# Patient Record
Sex: Male | Born: 1960 | Race: Black or African American | Hispanic: No | Marital: Married | Smoking: Never smoker
Health system: Southern US, Community
[De-identification: ages and names within clinical notes are randomized; demographics above are authoritative.]

## PROBLEM LIST (undated history)

## (undated) DIAGNOSIS — I2699 Other pulmonary embolism without acute cor pulmonale: Secondary | ICD-10-CM

## (undated) DIAGNOSIS — I8390 Asymptomatic varicose veins of unspecified lower extremity: Secondary | ICD-10-CM

## (undated) DIAGNOSIS — K4090 Unilateral inguinal hernia, without obstruction or gangrene, not specified as recurrent: Secondary | ICD-10-CM

## (undated) DIAGNOSIS — M109 Gout, unspecified: Secondary | ICD-10-CM

## (undated) DIAGNOSIS — G4730 Sleep apnea, unspecified: Secondary | ICD-10-CM

## (undated) DIAGNOSIS — I878 Other specified disorders of veins: Secondary | ICD-10-CM

## (undated) DIAGNOSIS — E119 Type 2 diabetes mellitus without complications: Secondary | ICD-10-CM

## (undated) DIAGNOSIS — M1990 Unspecified osteoarthritis, unspecified site: Secondary | ICD-10-CM

## (undated) DIAGNOSIS — E785 Hyperlipidemia, unspecified: Secondary | ICD-10-CM

## (undated) DIAGNOSIS — I10 Essential (primary) hypertension: Secondary | ICD-10-CM

## (undated) DIAGNOSIS — R251 Tremor, unspecified: Secondary | ICD-10-CM

## (undated) DIAGNOSIS — I82409 Acute embolism and thrombosis of unspecified deep veins of unspecified lower extremity: Secondary | ICD-10-CM

## (undated) DIAGNOSIS — I7300 Raynaud's syndrome without gangrene: Secondary | ICD-10-CM

## (undated) HISTORY — DX: Acute embolism and thrombosis of unspecified deep veins of unspecified lower extremity: I82.409

## (undated) HISTORY — PX: INGUINAL HERNIA REPAIR: SUR1180

## (undated) HISTORY — PX: TONSILLECTOMY: SHX5217

## (undated) HISTORY — DX: Asymptomatic varicose veins of unspecified lower extremity: I83.90

## (undated) HISTORY — DX: Gout, unspecified: M10.9

## (undated) HISTORY — DX: Other specified disorders of veins: I87.8

## (undated) HISTORY — DX: Raynaud's syndrome without gangrene: I73.00

## (undated) HISTORY — DX: Type 2 diabetes mellitus without complications: E11.9

## (undated) HISTORY — PX: TESTICLE REMOVAL: SHX68

## (undated) HISTORY — DX: Essential (primary) hypertension: I10

## (undated) HISTORY — DX: Hyperlipidemia, unspecified: E78.5

---

## 1998-12-12 ENCOUNTER — Ambulatory Visit: Admission: RE | Admit: 1998-12-12 | Discharge: 1998-12-12 | Payer: Self-pay

## 1999-12-17 ENCOUNTER — Encounter (INDEPENDENT_AMBULATORY_CARE_PROVIDER_SITE_OTHER): Payer: Self-pay | Admitting: Specialist

## 1999-12-17 ENCOUNTER — Other Ambulatory Visit: Admission: RE | Admit: 1999-12-17 | Discharge: 1999-12-17 | Payer: Self-pay | Admitting: *Deleted

## 2002-11-13 ENCOUNTER — Ambulatory Visit (HOSPITAL_BASED_OUTPATIENT_CLINIC_OR_DEPARTMENT_OTHER): Admission: RE | Admit: 2002-11-13 | Discharge: 2002-11-13 | Payer: Self-pay | Admitting: Pulmonary Disease

## 2004-10-30 ENCOUNTER — Ambulatory Visit: Admission: RE | Admit: 2004-10-30 | Discharge: 2004-10-30 | Payer: Self-pay | Admitting: Family Medicine

## 2005-04-20 ENCOUNTER — Encounter (INDEPENDENT_AMBULATORY_CARE_PROVIDER_SITE_OTHER): Payer: Self-pay | Admitting: Cardiology

## 2005-04-20 ENCOUNTER — Ambulatory Visit (HOSPITAL_COMMUNITY): Admission: RE | Admit: 2005-04-20 | Discharge: 2005-04-20 | Payer: Self-pay | Admitting: Rheumatology

## 2005-07-21 ENCOUNTER — Inpatient Hospital Stay (HOSPITAL_COMMUNITY): Admission: EM | Admit: 2005-07-21 | Discharge: 2005-07-26 | Payer: Self-pay | Admitting: *Deleted

## 2005-07-21 ENCOUNTER — Ambulatory Visit: Payer: Self-pay | Admitting: Cardiology

## 2005-07-21 ENCOUNTER — Encounter: Payer: Self-pay | Admitting: Cardiology

## 2005-07-21 DIAGNOSIS — I2699 Other pulmonary embolism without acute cor pulmonale: Secondary | ICD-10-CM

## 2005-07-21 HISTORY — DX: Other pulmonary embolism without acute cor pulmonale: I26.99

## 2005-08-05 ENCOUNTER — Ambulatory Visit (HOSPITAL_COMMUNITY): Admission: RE | Admit: 2005-08-05 | Discharge: 2005-08-05 | Payer: Self-pay | Admitting: Family Medicine

## 2006-12-30 ENCOUNTER — Encounter (INDEPENDENT_AMBULATORY_CARE_PROVIDER_SITE_OTHER): Payer: Self-pay | Admitting: Family Medicine

## 2006-12-30 ENCOUNTER — Ambulatory Visit: Payer: Self-pay | Admitting: Vascular Surgery

## 2006-12-30 ENCOUNTER — Ambulatory Visit (HOSPITAL_COMMUNITY): Admission: RE | Admit: 2006-12-30 | Discharge: 2006-12-30 | Payer: Self-pay | Admitting: Family Medicine

## 2007-09-05 ENCOUNTER — Ambulatory Visit: Payer: Self-pay | Admitting: Oncology

## 2007-09-12 LAB — FACTOR 8 ASSAY: Coagulation Factor VIII: 375 % — ABNORMAL HIGH (ref 73–140)

## 2007-09-12 LAB — BETA-2 GLYCOPROTEIN ANTIBODIES: Beta-2-Glycoprotein I IgM: <4 U/mL (ref <10)

## 2007-09-12 LAB — LUPUS ANTICOAGULANT PANEL
DRVVT 1:1 Mix: 40.7 secs (ref 36.1–47.0)
DRVVT: 69.6 secs — ABNORMAL HIGH (ref 36.1–47.0)
PTTLA 4:1 Mix: 49.7 secs — ABNORMAL HIGH (ref 36.3–48.8)
PTTLA Confirmation: 2.1 secs (ref <8.0)

## 2007-09-12 LAB — FACTOR 5 LEIDEN

## 2007-09-12 LAB — CARDIOLIPIN ANTIBODIES, IGG, IGM, IGA
Anticardiolipin IgG: <7 [GPL'U] (ref <11)
Anticardiolipin IgM: <7 [MPL'U] (ref <10)

## 2007-09-12 LAB — FACTOR 12 ASSAY: Factor XII Activity: 57 % — ABNORMAL LOW (ref 58–166)

## 2007-10-02 LAB — CBC WITH DIFFERENTIAL/PLATELET
Basophils Absolute: 0 10*3/uL (ref 0.0–0.1)
EOS%: 2.1 % (ref 0.0–7.0)
Eosinophils Absolute: 0.1 10*3/uL (ref 0.0–0.5)
HCT: 42.7 % (ref 38.7–49.9)
HGB: 15 g/dL (ref 13.0–17.1)
MCH: 32.5 pg (ref 28.0–33.4)
NEUT#: 1.5 10*3/uL (ref 1.5–6.5)
NEUT%: 47.5 % (ref 40.0–75.0)
RDW: 14 % (ref 11.2–14.6)
lymph#: 1.1 10*3/uL (ref 0.9–3.3)

## 2007-10-02 LAB — COMPREHENSIVE METABOLIC PANEL
Albumin: 4.5 g/dL (ref 3.5–5.2)
BUN: 13 mg/dL (ref 6–23)
CO2: 24 mEq/L (ref 19–32)
Calcium: 9.5 mg/dL (ref 8.4–10.5)
Chloride: 101 mEq/L (ref 96–112)
Creatinine, Ser: 1.25 mg/dL (ref 0.40–1.50)
Glucose, Bld: 100 mg/dL — ABNORMAL HIGH (ref 70–99)
Potassium: 3.7 mEq/L (ref 3.5–5.3)

## 2007-10-02 LAB — LACTATE DEHYDROGENASE: LDH: 132 U/L (ref 94–250)

## 2007-10-25 ENCOUNTER — Ambulatory Visit (HOSPITAL_COMMUNITY): Admission: RE | Admit: 2007-10-25 | Discharge: 2007-10-25 | Payer: Self-pay | Admitting: Oncology

## 2007-11-28 ENCOUNTER — Ambulatory Visit: Payer: Self-pay | Admitting: Oncology

## 2008-04-25 ENCOUNTER — Ambulatory Visit: Payer: Self-pay | Admitting: Oncology

## 2008-04-29 LAB — CBC WITH DIFFERENTIAL/PLATELET
BASO%: 0.6 % (ref 0.0–2.0)
Basophils Absolute: 0 10*3/uL (ref 0.0–0.1)
EOS%: 2.1 % (ref 0.0–7.0)
HCT: 39.9 % (ref 38.7–49.9)
HGB: 13.7 g/dL (ref 13.0–17.1)
MCH: 31.8 pg (ref 28.0–33.4)
MCHC: 34.4 g/dL (ref 32.0–35.9)
MONO#: 0.3 10*3/uL (ref 0.1–0.9)
NEUT%: 51.1 % (ref 40.0–75.0)
RDW: 11.7 % (ref 11.2–14.6)
WBC: 3.6 10*3/uL — ABNORMAL LOW (ref 4.0–10.0)
lymph#: 1.3 10*3/uL (ref 0.9–3.3)

## 2008-05-03 LAB — FACTOR 8 ASSAY: Coagulation Factor VIII: 286 % — ABNORMAL HIGH (ref 73–140)

## 2008-05-03 LAB — D-DIMER, QUANTITATIVE: D-Dimer, Quant: 0.23 ug/mL-FEU (ref 0.00–0.48)

## 2008-09-25 ENCOUNTER — Ambulatory Visit: Payer: Self-pay | Admitting: Oncology

## 2008-11-09 IMAGING — CT CT ANGIO CHEST
2 of 6 series · 19 of 36 positions shown · IV contrast (APPLIED)
Comparison: 08/05/2005.

CLINICAL DATA: History of pulmonary emboli.  Recent diagnosis of
deep venous thrombosis.

CT ANGIOGRAPHY CHEST
TECHNIQUE: Multidetector CT imaging of the chest using the
standard protocol during bolus administration of intravenous
contrast. Multiplanar reconstructed images obtained and reviewed to
evaluate the vascular anatomy.
Contrast: 100 ml of Hmnipaque-D66..

[Series 7: pe 1.0 b40f thins for pacs · axial · 0.70mm/px · z∈[-287,-84]mm · 16 of 229 slices shown]
[im 13/229  lung]
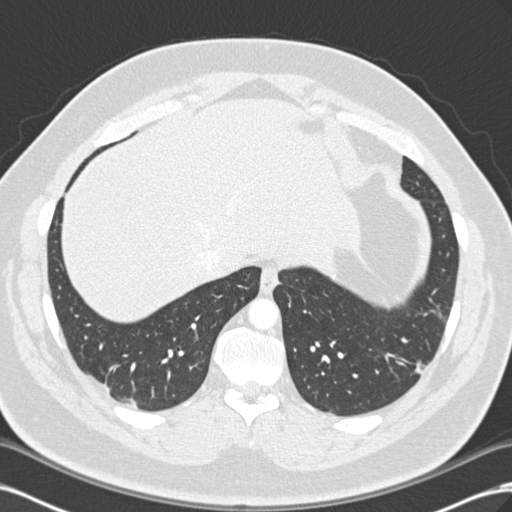
[im 26/229  mediastinal]
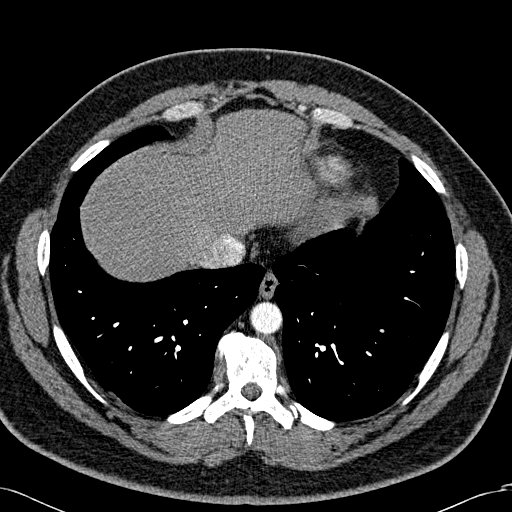
[im 39/229  lung]
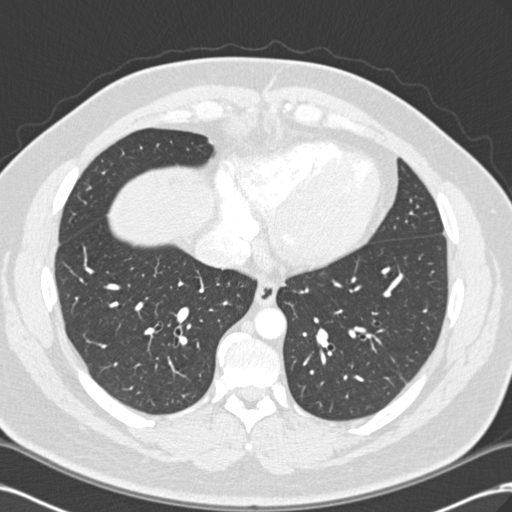
[im 51/229  mediastinal]
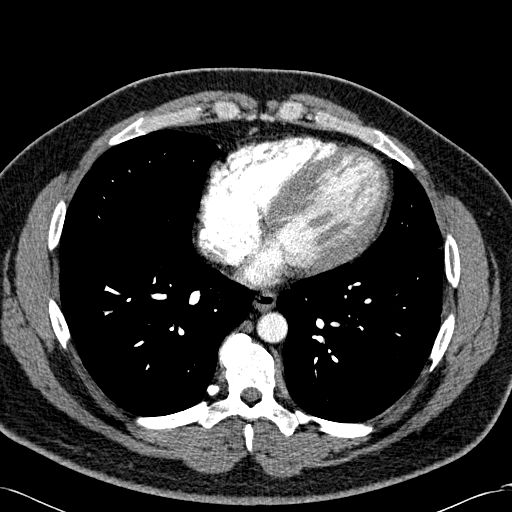
[im 64/229  lung]
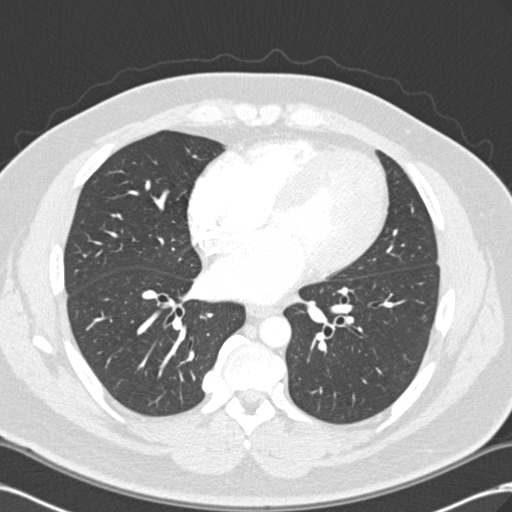
[im 77/229  mediastinal]
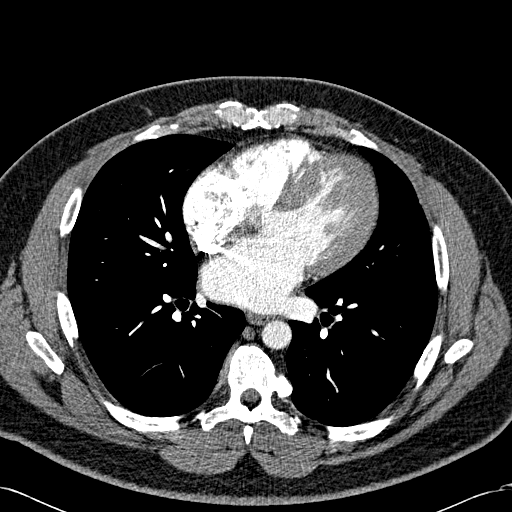
[im 89/229  lung]
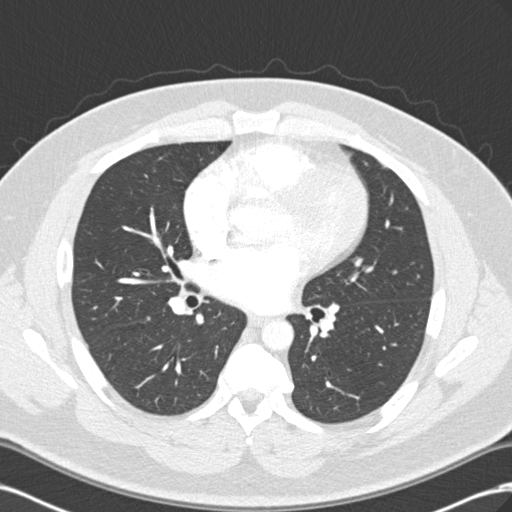
[im 102/229  mediastinal]
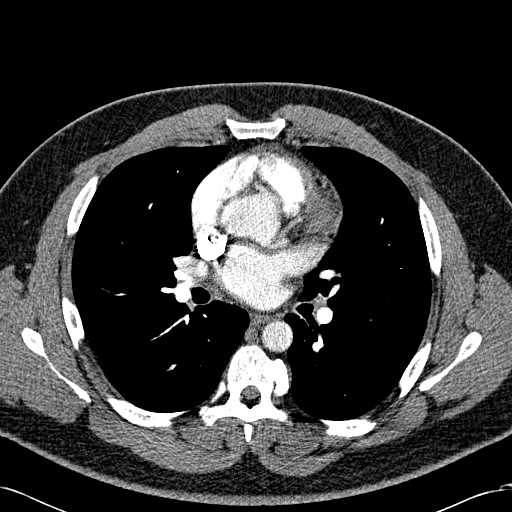
[im 127/229  lung]
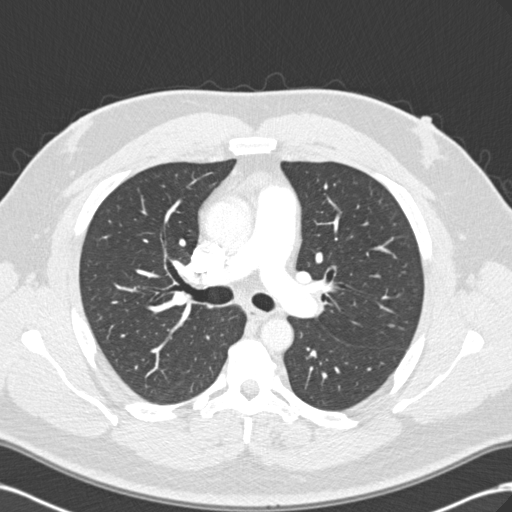
[im 140/229  mediastinal]
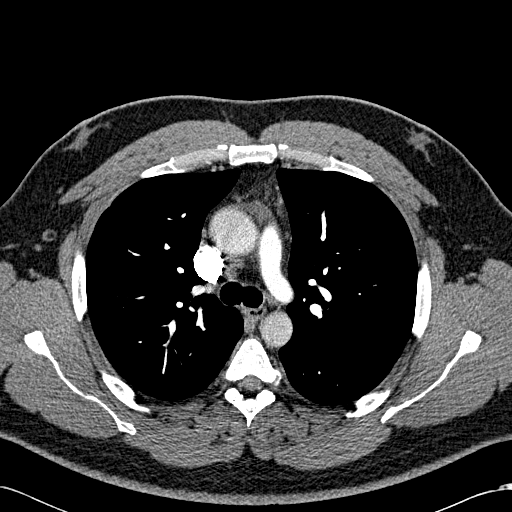
[im 153/229  lung]
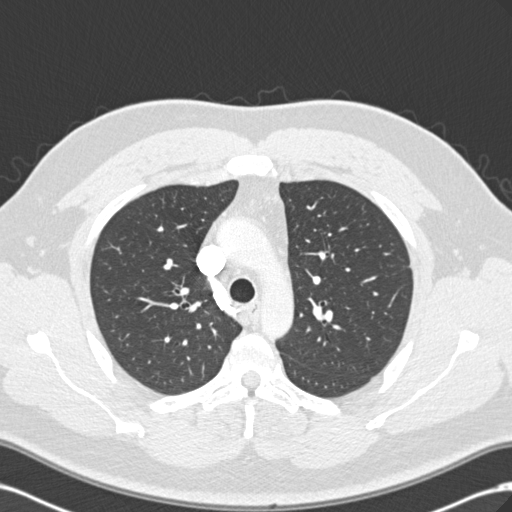
[im 165/229  mediastinal]
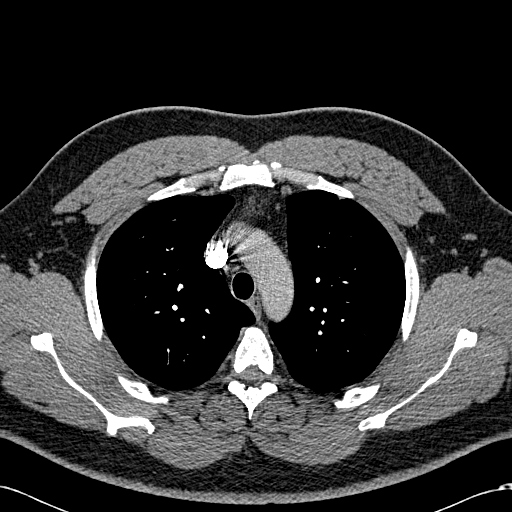
[im 178/229  lung]
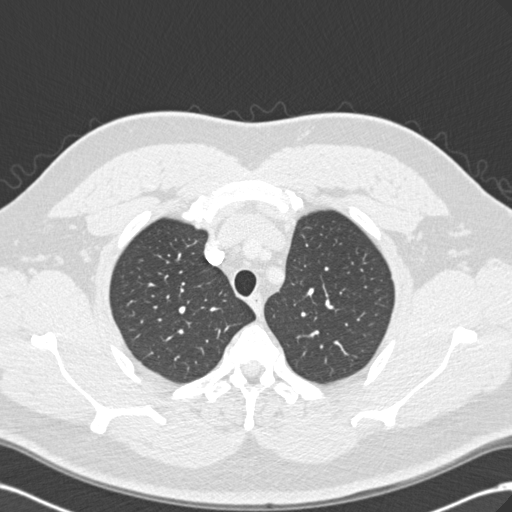
[im 191/229  mediastinal]
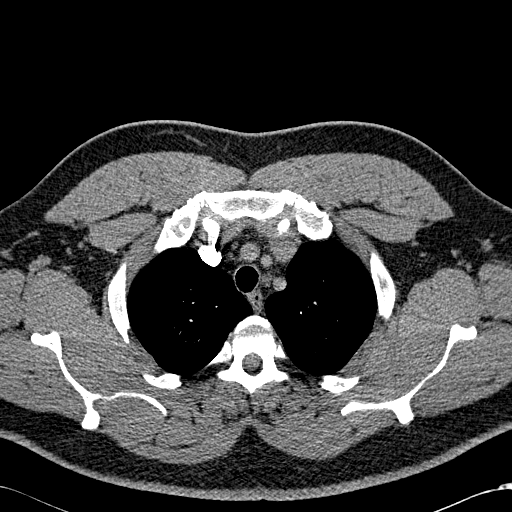
[im 203/229  lung]
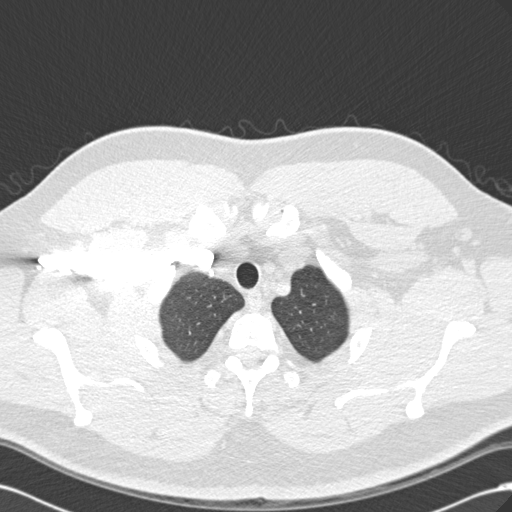
[im 216/229  mediastinal]
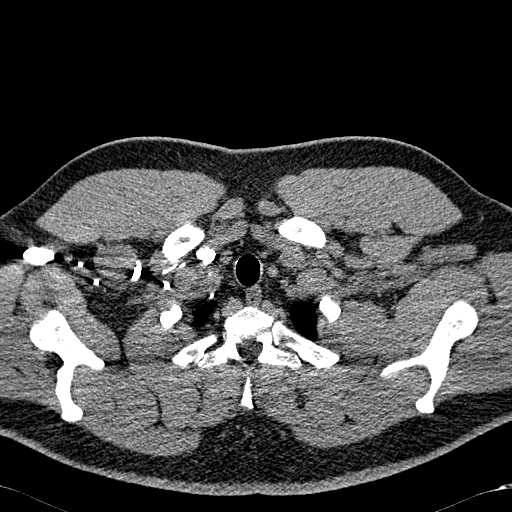

[Series 602: <mpr thick range> · coronal · 0.70mm/px · 3 of 77 slices shown]
[im 16/77  mediastinal]
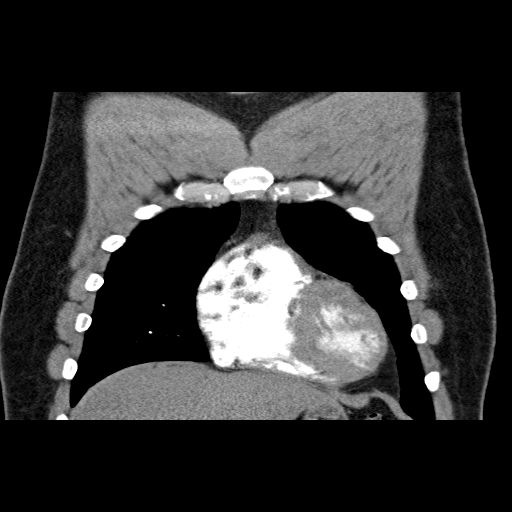
[im 31/77  mediastinal]
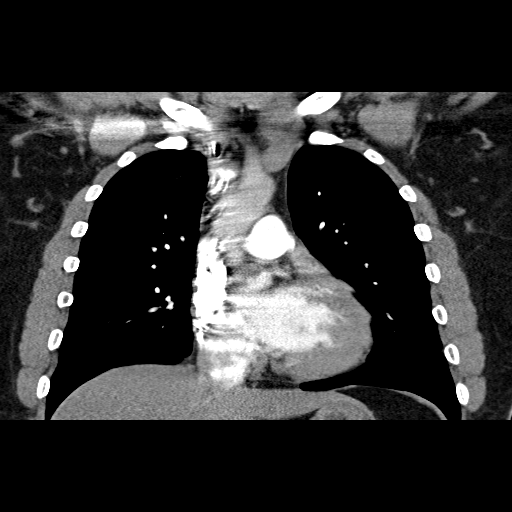
[im 46/77  mediastinal]
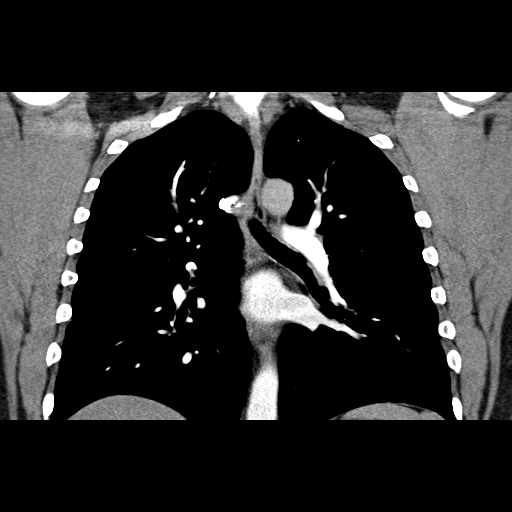

[19 of 36 positions shown; findings below may reference images not displayed]

FINDINGS: No pulmonary embolus.  No pathologically enlarged
mediastinal, hilar or axillary lymph nodes.  Incidental note is
made of bilateral gynecomastia.  Heart size within normal limits.
No pericardial effusion.

Tiny nodule right middle lobe (image 57) is nonspecific.  There is
bilateral upper and lower lobe scarring.  No pleural fluid.  Airway
unremarkable.

Incidental imaging of the upper abdomen shows no acute findings.
No worrisome lytic or sclerotic lesions.
IMPRESSION: 1.  No pulmonary embolus.

## 2010-12-11 NOTE — Discharge Summary (Signed)
Dalton Ochoa, Dalton Ochoa             ACCOUNT NO.:  [id number]   MEDICAL RECORD NO.:  [id number]          PATIENT TYPE:  INP   LOCATION:  6743                         FACILITY:  [facility]   PHYSICIAN:  Corinna L. Lendell [name], MDDATE OF BIRTH:  [birth year]   DATE OF ADMISSION:  07/20/2005  DATE OF DISCHARGE:  07/26/2005                                 DISCHARGE SUMMARY   DIAGNOSES:  1.  Acute pulmonary embolus, bilaterally.  2.  Polymyositis.  3.  Diabetes.  4.  Hypertension.  5.  Reported history of vasculitis.  6.  Poor venous axis.   DISCHARGE MEDICATIONS:  1.  Warfarin 7.5 mg p.o. nightly which may need to be changed to keep his      INR between 2 and 3.  2.  He may continue his other outpatient medications which include      prednisone 10 mg a day.  3.  Norvasc 5 mg a day.  4.  Calcium with vitamin D supplementation.  5.  Folic acid 1 mg a day.  6.  Quinapril/hydrochlorothiazide 20/25 mg a day.  7.  Methotrexate 2.5 mg 10 tablets every Thursday.  8.  Metformin 850 mg p.o. b.i.d.  9.  Keflex 500 mg p.o. t.i.d. for chronic skin condition involving the      hands.   CONDITION:  Stable.   CONSULTATIONS:  None.   PROCEDURE:  PICC line.   DIET:  Coumadin friendly diabetic.   ACTIVITY:  Ad lib.   FOLLOW UP:  Dr. Tiburcio Pea tomorrow for INR check and any adjustment in  Coumadin.   PERTINENT LABORATORY DATA:  INR at discharge is 1.9, on admission it was  normal.  Cardiac enzymes significant for troponin 0.18. Basic metabolic  panel significant for a glucose of 135, otherwise unremarkable.  CBC on  admission was unremarkable.  Blood cultures negative.  UA also is negative  for nitrites or leukocyte esterase, calcium oxalate crystals were present as  well as 30 protein and 15 ketones.   Chest x-ray showed nothing acute.  The CT scan of the chest showed massive  bilateral pulmonary emboli with a cavitary lesion at the left lung base  possibly due to septic embolus. Tiny peripheral  densities in both lungs  nonspecific, but probably inflammatory in origin.  EKG showed sinus  tachycardia, age indeterminate septal infarct, left axis deviation.   HISTORY AND HOSPITAL COURSE:  Dalton Ochoa is a 50 year old black male with a  history of some type of myositis and vasculitis for which he takes  prednisone and is followed by Dr. Kellie Simmering.  He presented with shortness of  breath which was sudden, he had no chest pain. He was tachypneic and  tachycardic.  Please see H&P for complete details.  He had good oxygen  saturation.  Upon admission, they were unable to get adequate IV access for  CT angiogram of chest to rule out PE. He was therefore admitted and a PICC  line was placed.  CAT scan was significant for a saddle embolus and other  bilateral PEs.  The patient was started on heparin and Coumadin.  He  was  placed in the step-down unit and given supplemental oxygen.  The patient had  improvement in his shortness of breath.  He was ambulating without  difficulty. His INR was quite slow to increase and at the time of discharge  is 1.9, however, it has been steadily increasing and I suspect tomorrow will  be 2.0 or greater. I have asked that the patient follow up for an INR check  tomorrow with Dr. Tiburcio Pea and I will call to leave a voice mail with him.  He  has been given a dose of Lovenox 1.5 mg/kg subcutaneously today prior to  discharge.  If his INR is 2.0 or greater, Lovenox will not be needed any  further.   The patient had his metformin held after the CT angiogram and this was  resumed.  His blood pressure and diabetes remained relatively stable.   With respect to the cavitary area on CAT scan, the patient was not coughing,  he had no chest pain and had a normal white blood cell count.  He was not  given antibiotics and at this point, clinically this is not consistent with  a lung abscess.  I would recommend, however, that he get a repeat CT of the  chest in four to six  weeks to further evaluate this area.      Corinna L. Lendell [name], MD  Electronically Signed     CLS/MEDQ  D:  07/26/2005  T:  07/26/2005  Job:  [id number]

## 2010-12-11 NOTE — H&P (Signed)
NAME:  Dalton Ochoa, NEARHOOD NO.:  [id number]   MEDICAL RECORD NO.:  [id number]          PATIENT TYPE:  INP   LOCATION:  1831                         FACILITY:  [facility]   PHYSICIAN:  Theone Stanley, MD   DATE OF BIRTH:  10-Sep-1960   DATE OF ADMISSION:  07/20/2005  DATE OF DISCHARGE:                                HISTORY & PHYSICAL   CHIEF COMPLAINT:  Shortness of breath.   HISTORY OF PRESENT ILLNESS:  Mr. Haig is a very pleasant 50 year old  African-American gentleman with a history of myositis, vascularis, diabetes  and hypertension, who had the very onset of shortness of breath at 10:30  yesterday.  The patient was walking.  He states that he was not exerting  himself, and he suddenly became short of breath.  He had no chest pain.  The  shortness of breath lasted about 15 minutes.  He had some lightheadedness  associated with it.  It only lasted about 8 minutes.  He had some  diaphoresis, but the patient did not feel he was sweating too much.  He  received help while he was in the mall, and EMS arrived.  At that time, he  states that his blood pressure was 200 over some number; 5 minutes later it  was 140/96.  He is tachypneic and apparently tachycardic.  The patient has  never experienced this before.  The only additional thing is he noted some  left thigh pain earlier in the day, which lasted about an hour, but went  away completely.  The patient is an extremely difficult person to get IV  access.  Because of this, it was difficult to obtain a CT scan.  Therefore,  he will be admitted.  A PICC line will be placed, and he will be assessed  for PE.   PAST MEDICAL HISTORY:  1.  Myositis.  2.  Vasculitis.  3.  Diabetes.  4.  Hypertension.  5.  The patient has a history of sores on his fingers; Dr. Amanda Pea is      involved in his care.   MEDICATIONS:  1.  Prednisone 10 mg one p.o. daily.  2.  Norvasc 5 mg one p.o. daily.  3.  Calcium with vitamin D.  4.   Folic acid 1 mg daily.  5.  Quinapril/hydrochlorothiazide 20/25 one p.o. daily.  6.  Methotrexate 2.5 mg 10 tablets every Thursday.  7.  Metformin 850 mg b.i.d.  8.  Keflex 500 mg t.i.d.  He has been taking this since July secondary to      his hand issues.   ALLERGIES:  None.   FAMILY HISTORY:  Diabetes and hypertension.  There are no other family  members with myositis or vasculitis.   SOCIAL HISTORY:  The patient lives in [place].  He is married and has 2  children.  No tobacco, alcohol or illicit drug use.   REVIEW OF SYSTEMS:  Please see HPI.  Otherwise, all systems were negative.   PHYSICAL EXAMINATION:  GENERAL:  Mr. Teutsch is a very pleasant 49 year old  gentleman sitting on the gurney in no  acute distress.  VITAL SIGNS:  Temperature 98.1, blood pressure 133/70, pulse of 114,  respiratory rate 22, satting 100% on 2 liters.  HEENT:  Head is atraumatic and normocephalic.  Extraocular movements intact.  Ears and nose without discharge.  Throat clear.  NECK:  Supple.  No lymphadenopathy.  HEART:  Regular rate and rhythm.  No murmurs, rubs, or gallops heard.  LUNGS:  Clear to auscultation bilaterally.  ABDOMEN:  Soft, nontender, nondistended.  EXTREMITIES:  Mild pitting edema.  NEUROLOGIC:  The patient is alert and oriented x3.  Nonfocal.  GU:  Deferred.  SKIN:  The patient had evidence of dry skin in the lower extremities.   LABORATORIES AND RADIOLOGY:  EKG did not show any acute changes.  Chest x-  ray did not show any active disease.  Sodium 137, potassium 4.0, chloride  107, BUN 12, glucose 135, hemoglobin of 14, hematocrit of 42, creatinine 1,  white count 7, hemoglobin 13, hematocrit 39, platelets 206.  INR is 1.  Troponin less than 0.05.   ASSESSMENT AND PLAN:  Mr. Kyne presented with the sudden onset of  shortness of breath.  1.  Shortness of breath.  Initial work-up including EKG, chest x-ray and      laboratories were not conclusive.  Because of his  history of vasculitis      and the sudden onset, there was concern for pulmonary embolism.      However, during his stay, the was quite difficult to obtain any type of      IV access.  The ER doctor attempted to place a central line.  This was      successful; however, it was the wrong central line.  An attempt was made      to change it over wire; however, this was not successful.  He will be      admitted at this time.  PICC line placed for assessment for PE.  In the      meantime, because the suspicion is high since there is no other evidence      of cardiac, will treat him as if he does have a pulmonary embolism, and      once imaging is obtained, it will be decided to continue or not at that      point in time.  2.  Myositis/vasculitis.  Continue on his home medications.  3.  Hypertension.  Continue on his home medications.  4.  Diabetes.  Will hold off on giving his metformin, since we anticipate      him getting a CT angiogram.  5.  Finger sores.  Continue on his prophylactic Keflex.      Theone Stanley, MD  Electronically Signed     AEJ/MEDQ  D:  07/21/2005  T:  07/21/2005  Job:  [id number]   cc:   Aundra Dubin, M.D.  [address]  [place]  [place] [address]   Juluis Mire, M.D.  Fax: [phone]   Dionne Ano. Everlene Other, M.D.  Fax: [phone]

## 2012-02-02 ENCOUNTER — Other Ambulatory Visit: Payer: Self-pay | Admitting: Sports Medicine

## 2012-02-02 DIAGNOSIS — M545 Low back pain: Secondary | ICD-10-CM

## 2012-02-04 ENCOUNTER — Other Ambulatory Visit: Payer: Self-pay

## 2012-02-05 ENCOUNTER — Ambulatory Visit
Admission: RE | Admit: 2012-02-05 | Discharge: 2012-02-05 | Disposition: A | Discharge status: Home / Self Care | Payer: BC Managed Care – PPO | Source: Ambulatory Visit | Attending: Sports Medicine | Admitting: Sports Medicine

## 2012-02-05 DIAGNOSIS — M545 Low back pain: Secondary | ICD-10-CM

## 2012-10-24 ENCOUNTER — Encounter (HOSPITAL_BASED_OUTPATIENT_CLINIC_OR_DEPARTMENT_OTHER): Payer: BC Managed Care – PPO | Attending: General Surgery

## 2012-10-24 DIAGNOSIS — I10 Essential (primary) hypertension: Secondary | ICD-10-CM | POA: Insufficient documentation

## 2012-10-24 DIAGNOSIS — Z86711 Personal history of pulmonary embolism: Secondary | ICD-10-CM | POA: Insufficient documentation

## 2012-10-24 DIAGNOSIS — Z79899 Other long term (current) drug therapy: Secondary | ICD-10-CM | POA: Insufficient documentation

## 2012-10-24 DIAGNOSIS — L97909 Non-pressure chronic ulcer of unspecified part of unspecified lower leg with unspecified severity: Secondary | ICD-10-CM | POA: Insufficient documentation

## 2012-10-24 DIAGNOSIS — E785 Hyperlipidemia, unspecified: Secondary | ICD-10-CM | POA: Insufficient documentation

## 2012-10-24 DIAGNOSIS — E119 Type 2 diabetes mellitus without complications: Secondary | ICD-10-CM | POA: Insufficient documentation

## 2012-10-24 DIAGNOSIS — Z86718 Personal history of other venous thrombosis and embolism: Secondary | ICD-10-CM | POA: Insufficient documentation

## 2012-10-25 NOTE — H&P (Signed)
NAME:  Dalton Ochoa, Dalton Ochoa NO.:  [id number]  MEDICAL RECORD NO.:  [id number]  LOCATION:  FOOT                         FACILITY:  [facility]  PHYSICIAN:  Joanne Gavel, M.D.        DATE OF BIRTH:  May 21, 1961  DATE OF ADMISSION:  10/24/2012 DATE OF DISCHARGE:                             HISTORY & PHYSICAL   CHIEF COMPLAINT:  Wound, right leg.  HISTORY OF PRESENT ILLNESS:  This 52 year old male with a history of DVT and pulmonary embolism in 2006, has had stasis changes in both legs since, developed a wound above his right ankle several weeks ago.  He has not been treating this with any medication.  PAST MEDICAL HISTORY:  Significant for hyperlipidemia, hypogonadism, diabetes type 2, dermatomyositis, hypertension, eczema, leukopenia, Raynaud phenomena, and venous stasis.  He had a pulmonary embolus in 2006.  PAST SURGICAL HISTORY:  Negative.  SOCIAL HISTORY:  Cigarettes none.  Alcohol none.  MEDICATIONS:  Warfarin, Testim, metformin, pravastatin, quinapril, tramadol, betamethasone, Cipro.  REVIEW OF SYSTEMS:  As above.  PHYSICAL EXAMINATION:  VITAL SIGNS:  Temp 98.1, pulse 85, respirations 18, blood pressure 127/82. GENERAL APPEARANCE:  Well developed, somewhat obese, in no distress. CRANIUM:  Normocephalic. CHEST:  Clear. HEART:  Regular rhythm. ABDOMEN:  Not examined. EXTREMITIES:  Examination of the right lower extremity reveals marked stasis changes.  ABI is 1.2.  There is a 1.0 x 1.2 rather superficial wound at the right medial lower extremity just above the malleolus. This is in the midst of a sizable area of stasis dermatitis.  There is no odor or discharge.  IMPRESSION:  Chronic venous hypertension with ulceration.  PLAN:  We will start with silver alginate and Unna boot.  We will get venous studies and I will see him in 7 days.     Joanne Gavel, M.D.     RA/MEDQ  D:  10/24/2012  T:  10/25/2012  Job:  [id number]

## 2012-11-01 ENCOUNTER — Other Ambulatory Visit (HOSPITAL_COMMUNITY): Payer: Self-pay | Admitting: General Surgery

## 2012-11-01 DIAGNOSIS — I739 Peripheral vascular disease, unspecified: Secondary | ICD-10-CM

## 2012-11-03 ENCOUNTER — Ambulatory Visit (HOSPITAL_COMMUNITY)
Admission: RE | Admit: 2012-11-03 | Discharge: 2012-11-03 | Disposition: A | Discharge status: Home / Self Care | Payer: BC Managed Care – PPO | Source: Ambulatory Visit | Attending: Cardiovascular Disease | Admitting: Cardiovascular Disease

## 2012-11-03 DIAGNOSIS — I739 Peripheral vascular disease, unspecified: Secondary | ICD-10-CM | POA: Insufficient documentation

## 2012-11-03 NOTE — Progress Notes (Signed)
Venous Duplex Lower Ext. Completed. Dalton Ochoa D  

## 2014-06-05 ENCOUNTER — Encounter (HOSPITAL_BASED_OUTPATIENT_CLINIC_OR_DEPARTMENT_OTHER): Payer: 59 | Attending: General Surgery

## 2014-06-05 DIAGNOSIS — L97312 Non-pressure chronic ulcer of right ankle with fat layer exposed: Secondary | ICD-10-CM | POA: Diagnosis not present

## 2014-06-05 DIAGNOSIS — I87331 Chronic venous hypertension (idiopathic) with ulcer and inflammation of right lower extremity: Secondary | ICD-10-CM | POA: Diagnosis not present

## 2014-06-06 NOTE — Progress Notes (Signed)
[facility]  NAME:  Dalton Ochoa, Dalton Ochoa NO.:  [id number]  MEDICAL RECORD NO.:  [id number]      DATE OF BIRTH:  Apr 24, 1961  PHYSICIAN:  Judene Companion, M.D.           VISIT DATE:                                  OFFICE VISIT   This is a 53 year old African American male who has a long history of venous hypertension with ulcers that have occurred bilaterally.  Today, he presents to the [facility]ound [facility] with venous ulcers on the medial side of his right ankle.  He has been treated before successfully with Unna boots and we elected to put some silver alginate over the ulcer and then treated him with an Haematologist.  This gentleman had a blood pressure of 139/74, respirations 20, pulse 78, temperature 98.  He is 5 feet and 11 inches, weighs 250 pounds.  His medications include, metformin as he is a type 2 diabetic, Xarelto as an anticoagulate, benazepril for his hypertension, allopurinol for gout.  He does not appear to have any infection and he did not need any debridement.  I think that if we keep him with compression with an Unna boot, he will heal up just like he has in the past.  He has evidence on his skin of bilateral venous stasis.  DIAGNOSES: 1. Bilateral venous stasis with ulcers, right ankle. 2. Type 2 diabetes. 3. Hypertension. 4. Gout.     Judene Companion, M.D.     PP/MEDQ  D:  06/05/2014  T:  06/06/2014  Job:  [id number]

## 2014-06-12 DIAGNOSIS — I87331 Chronic venous hypertension (idiopathic) with ulcer and inflammation of right lower extremity: Secondary | ICD-10-CM | POA: Diagnosis not present

## 2014-06-12 DIAGNOSIS — L97312 Non-pressure chronic ulcer of right ankle with fat layer exposed: Secondary | ICD-10-CM | POA: Diagnosis not present

## 2014-06-19 DIAGNOSIS — I87331 Chronic venous hypertension (idiopathic) with ulcer and inflammation of right lower extremity: Secondary | ICD-10-CM | POA: Diagnosis not present

## 2014-06-19 DIAGNOSIS — L97312 Non-pressure chronic ulcer of right ankle with fat layer exposed: Secondary | ICD-10-CM | POA: Diagnosis not present

## 2014-06-26 ENCOUNTER — Encounter (HOSPITAL_BASED_OUTPATIENT_CLINIC_OR_DEPARTMENT_OTHER): Payer: 59 | Attending: General Surgery

## 2014-06-26 DIAGNOSIS — L97919 Non-pressure chronic ulcer of unspecified part of right lower leg with unspecified severity: Secondary | ICD-10-CM | POA: Insufficient documentation

## 2014-06-26 DIAGNOSIS — I87331 Chronic venous hypertension (idiopathic) with ulcer and inflammation of right lower extremity: Secondary | ICD-10-CM | POA: Insufficient documentation

## 2014-06-26 DIAGNOSIS — L97319 Non-pressure chronic ulcer of right ankle with unspecified severity: Secondary | ICD-10-CM | POA: Insufficient documentation

## 2014-07-03 DIAGNOSIS — L97919 Non-pressure chronic ulcer of unspecified part of right lower leg with unspecified severity: Secondary | ICD-10-CM | POA: Diagnosis not present

## 2014-07-03 DIAGNOSIS — I87331 Chronic venous hypertension (idiopathic) with ulcer and inflammation of right lower extremity: Secondary | ICD-10-CM | POA: Diagnosis not present

## 2014-07-03 DIAGNOSIS — L97319 Non-pressure chronic ulcer of right ankle with unspecified severity: Secondary | ICD-10-CM | POA: Diagnosis not present

## 2014-07-09 ENCOUNTER — Other Ambulatory Visit: Payer: Self-pay | Admitting: *Deleted

## 2014-07-09 ENCOUNTER — Encounter: Payer: Self-pay | Admitting: Vascular Surgery

## 2014-07-09 DIAGNOSIS — L97319 Non-pressure chronic ulcer of right ankle with unspecified severity: Secondary | ICD-10-CM

## 2014-07-09 DIAGNOSIS — I87311 Chronic venous hypertension (idiopathic) with ulcer of right lower extremity: Secondary | ICD-10-CM

## 2014-07-09 DIAGNOSIS — L97919 Non-pressure chronic ulcer of unspecified part of right lower leg with unspecified severity: Secondary | ICD-10-CM

## 2014-07-09 DIAGNOSIS — I872 Venous insufficiency (chronic) (peripheral): Secondary | ICD-10-CM

## 2014-07-10 DIAGNOSIS — L97319 Non-pressure chronic ulcer of right ankle with unspecified severity: Secondary | ICD-10-CM | POA: Diagnosis not present

## 2014-07-10 DIAGNOSIS — L97919 Non-pressure chronic ulcer of unspecified part of right lower leg with unspecified severity: Secondary | ICD-10-CM | POA: Diagnosis not present

## 2014-07-10 DIAGNOSIS — I87331 Chronic venous hypertension (idiopathic) with ulcer and inflammation of right lower extremity: Secondary | ICD-10-CM | POA: Diagnosis not present

## 2014-07-17 ENCOUNTER — Encounter: Payer: Self-pay | Admitting: Vascular Surgery

## 2014-07-17 DIAGNOSIS — L97319 Non-pressure chronic ulcer of right ankle with unspecified severity: Secondary | ICD-10-CM | POA: Diagnosis not present

## 2014-07-17 DIAGNOSIS — I87331 Chronic venous hypertension (idiopathic) with ulcer and inflammation of right lower extremity: Secondary | ICD-10-CM | POA: Diagnosis not present

## 2014-07-17 DIAGNOSIS — L97919 Non-pressure chronic ulcer of unspecified part of right lower leg with unspecified severity: Secondary | ICD-10-CM | POA: Diagnosis not present

## 2014-07-18 ENCOUNTER — Encounter: Payer: 59 | Admitting: Vascular Surgery

## 2014-07-18 ENCOUNTER — Ambulatory Visit (HOSPITAL_COMMUNITY)
Admission: RE | Admit: 2014-07-18 | Discharge: 2014-07-18 | Disposition: A | Discharge status: Home / Self Care | Payer: 59 | Source: Ambulatory Visit | Attending: Vascular Surgery | Admitting: Vascular Surgery

## 2014-07-18 ENCOUNTER — Ambulatory Visit (INDEPENDENT_AMBULATORY_CARE_PROVIDER_SITE_OTHER): Payer: 59 | Admitting: Vascular Surgery

## 2014-07-18 ENCOUNTER — Encounter: Payer: Self-pay | Admitting: Vascular Surgery

## 2014-07-18 ENCOUNTER — Encounter (HOSPITAL_COMMUNITY): Payer: 59

## 2014-07-18 VITALS — BP 141/84 | HR 88 | Ht 71.0 in | Wt 260.1 lb

## 2014-07-18 DIAGNOSIS — I872 Venous insufficiency (chronic) (peripheral): Secondary | ICD-10-CM

## 2014-07-18 DIAGNOSIS — I83018 Varicose veins of right lower extremity with ulcer other part of lower leg: Secondary | ICD-10-CM

## 2014-07-18 DIAGNOSIS — I8311 Varicose veins of right lower extremity with inflammation: Secondary | ICD-10-CM

## 2014-07-18 DIAGNOSIS — I83015 Varicose veins of right lower extremity with ulcer other part of foot: Secondary | ICD-10-CM

## 2014-07-18 DIAGNOSIS — I83011 Varicose veins of right lower extremity with ulcer of thigh: Secondary | ICD-10-CM

## 2014-07-18 DIAGNOSIS — I83019 Varicose veins of right lower extremity with ulcer of unspecified site: Secondary | ICD-10-CM

## 2014-07-18 DIAGNOSIS — I87311 Chronic venous hypertension (idiopathic) with ulcer of right lower extremity: Secondary | ICD-10-CM | POA: Diagnosis not present

## 2014-07-18 DIAGNOSIS — I83013 Varicose veins of right lower extremity with ulcer of ankle: Secondary | ICD-10-CM

## 2014-07-18 DIAGNOSIS — L97319 Non-pressure chronic ulcer of right ankle with unspecified severity: Secondary | ICD-10-CM | POA: Insufficient documentation

## 2014-07-18 DIAGNOSIS — I83014 Varicose veins of right lower extremity with ulcer of heel and midfoot: Secondary | ICD-10-CM

## 2014-07-18 DIAGNOSIS — I8312 Varicose veins of left lower extremity with inflammation: Secondary | ICD-10-CM | POA: Diagnosis not present

## 2014-07-18 DIAGNOSIS — I83012 Varicose veins of right lower extremity with ulcer of calf: Secondary | ICD-10-CM

## 2014-07-18 DIAGNOSIS — L97919 Non-pressure chronic ulcer of unspecified part of right lower leg with unspecified severity: Principal | ICD-10-CM

## 2014-07-18 NOTE — Progress Notes (Signed)
Patient name: Dalton Ochoa MRN: [id number] DOB: Oct 23, 1960 Sex: male   Referred by: Kenton Kingfisher  Reason for referral:  Chief Complaint  Patient presents with  . New Evaluation    venous stasis L LE w/ ulcer of R ankle    HISTORY OF PRESENT ILLNESS: Patient is today for evaluation of severe bilateral venous stasis ulceration and venous hypertension. He has a history of prior pulmonary embolus around 2006.  He had been on Coumadin for many years and recently was changed to Xarelto. He has had venous ulcers on his right ankle on several different occasions does not have any history of venous ulcer on the left. He does have marked chronic changes of venous hypertension with hemosiderin and the skin changes bilaterally. Has had no further episodes of DVT. He does report pain associated with prolonged standing he does have marked swelling. He has been in compression garments for many years. Fortunately was able to heal up his most recent venous ulcer with care at the wound center.  Past Medical History  Diagnosis Date  . Venous stasis   . Diabetes mellitus without complication   . Hypertension   . Gout   . DVT (deep venous thrombosis)     History reviewed. No pertinent past surgical history.  History   Social History  . Marital Status: Married    Spouse Name: N/A    Number of Children: N/A  . Years of Education: N/A   Occupational History  . Not on file.   Social History Main Topics  . Smoking status: Never Smoker   . Smokeless tobacco: Not on file  . Alcohol Use: 0.0 oz/week    0 Not specified per week     Comment: occ  . Drug Use: No  . Sexual Activity: Not on file   Other Topics Concern  . Not on file   Social History Narrative    Family History  Problem Relation Age of Onset  . Deep vein thrombosis Mother   . Hypertension Mother   . Diabetes Father     Allergies as of 07/18/2014  . (No Known Allergies)    Current Outpatient Prescriptions on File  Prior to Visit  Medication Sig Dispense Refill  . ALLOPURINOL PO Take 300 mg by mouth daily.     [name] BENAZEPRIL HCL PO Take 20 mg by mouth daily.     [name] METFORMIN HCL PO Take 500 mg by mouth 2 (two) times daily.     . Rivaroxaban (XARELTO PO) Take 20 mg by mouth daily.      No current facility-administered medications on file prior to visit.     REVIEW OF SYSTEMS:  Positives indicated with an "X"  CARDIOVASCULAR:  [ ]  chest pain   [ ]  chest pressure   [ ]  palpitations   [ ]  orthopnea   [ ]  dyspnea on exertion   [ ]  claudication   [ ]  rest pain   [ x] DVT   [ ]  phlebitis PULMONARY:   [ ]  productive cough   [ ]  asthma   [ ]  wheezing NEUROLOGIC:   [ ]  weakness  [ ]  paresthesias  [ ]  aphasia  [ ]  amaurosis  [ ]  dizziness HEMATOLOGIC:   [ ]  bleeding problems   [ ]  clotting disorders MUSCULOSKELETAL:  [ ]  joint pain   [ ]  joint swelling GASTROINTESTINAL: [ ]   blood in stool  [ ]   hematemesis GENITOURINARY:  [ ]   dysuria  [ ]   hematuria PSYCHIATRIC:  [ ]  history of major depression INTEGUMENTARY:  [ ]  rashes  [ ]  ulcers CONSTITUTIONAL:  [ ]  fever   [ ]  chills  PHYSICAL EXAMINATION:  General: The patient is a well-nourished male, in no acute distress. Vital signs are BP 141/84 mmHg  Pulse 88  Ht 5\' 11"  (1.803 m)  Wt 260 lb 1.6 oz (117.981 kg)  BMI 36.29 kg/m2  SpO2 100% Pulmonary: There is a good air exchange   Musculoskeletal: There are no major deformities.  There is no significant extremity pain. Neurologic: No focal weakness or paresthesias are detected, Skin: Brawny edema with superficial hemosiderin deposit a bilaterally from his midcalf down to his ankle. Marked swelling bilaterally Psychiatric: The patient has normal affect.  Pulse status: 2+ dorsalis pedis pulses bilaterally  [facility] Vascular Lab Studies:  Ordered and Independently Reviewed bilateral superficial and some deep venous reflux. On the right leg he does have marked enlargement throughout his great saphenous vein  from the saphenofemoral junction down to his calf. Does have reflux in his common superficial femoral and popliteal vein. 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 On the left leg he has had deep venous reflux and does not have any significant deep venous reflux in his great saphenous vein. Does have reflux in his small saphenous vein with diameter of 4.7 2.8 cm.  Impression and Plan:  Severe bilateral venous stasis disease and venous hypertension. I explained the critical importance of continued compression which he is doing. Suspect that he would be an excellent candidate for ablation of his right great saphenous and potentially left small saphenous. I did image his right small saphenous vein and this does show reflux but is not markedly enlarged so would not recommend this at this time. We will see him again in February to determine has conservative treatment. We'll make a decision at that time for potential ablation of his right great saphenous vein. Would discontinue his Xarelto around the time of the procedure    Daphnie Venturini [facility] Office: [phone]

## 2014-08-07 ENCOUNTER — Encounter (HOSPITAL_COMMUNITY): Payer: 59

## 2014-08-07 ENCOUNTER — Encounter: Payer: 59 | Admitting: Vascular Surgery

## 2014-09-06 ENCOUNTER — Encounter: Payer: Self-pay | Admitting: Vascular Surgery

## 2014-09-10 ENCOUNTER — Encounter: Payer: Self-pay | Admitting: Vascular Surgery

## 2014-09-10 ENCOUNTER — Ambulatory Visit (INDEPENDENT_AMBULATORY_CARE_PROVIDER_SITE_OTHER): Payer: 59 | Admitting: Vascular Surgery

## 2014-09-10 VITALS — BP 150/87 | HR 82 | Ht 71.0 in | Wt 269.6 lb

## 2014-09-10 DIAGNOSIS — I83015 Varicose veins of right lower extremity with ulcer other part of foot: Secondary | ICD-10-CM

## 2014-09-10 DIAGNOSIS — I83018 Varicose veins of right lower extremity with ulcer other part of lower leg: Secondary | ICD-10-CM

## 2014-09-10 DIAGNOSIS — I83012 Varicose veins of right lower extremity with ulcer of calf: Secondary | ICD-10-CM

## 2014-09-10 DIAGNOSIS — I83011 Varicose veins of right lower extremity with ulcer of thigh: Secondary | ICD-10-CM

## 2014-09-10 DIAGNOSIS — I83019 Varicose veins of right lower extremity with ulcer of unspecified site: Secondary | ICD-10-CM

## 2014-09-10 DIAGNOSIS — I83014 Varicose veins of right lower extremity with ulcer of heel and midfoot: Secondary | ICD-10-CM

## 2014-09-10 DIAGNOSIS — I83013 Varicose veins of right lower extremity with ulcer of ankle: Secondary | ICD-10-CM

## 2014-09-10 DIAGNOSIS — L97919 Non-pressure chronic ulcer of unspecified part of right lower leg with unspecified severity: Principal | ICD-10-CM

## 2014-09-10 NOTE — Progress Notes (Signed)
Problems with Activities of Daily Living Secondary to Leg Pain  1. Dalton Ochoa works 12 hour shifts standing for prolonged periods.. That is very difficult for him due to leg pain.    2. Dalton Ochoa states that he has great difficulty when driving car on long trips due to leg pain.    3. Dalton Ochoa, Dalton Ochoa states he has difficulty sleeping due to leg pain.   Failure of  Conservative Therapy:  1. Worn 20-30 mm Hg thigh high compression hose >3 months with no relief of symptoms.  2. Frequently elevates legs-no relief of symptoms  3. Taken Ibuprofen 600 Mg TID with no relief of symptoms.   Here today for follow-up of his severe bilateral venous hypertension. He has been extremely compliant with her graduated compression garments. Fortunately has had no recurrent venous ulceration.   On physical exam: Palpable dorsalis pedis pulses bilaterally Marked changes of venous hypertension with telangiectasia circumferentially over his distal calves extending onto his feet bilaterally. Moderate swelling.   I reviewed his venous duplex from several months ago with the patient. Also reimage his veins with SonoSite. He has markedly enlarged great saphenous vein on the right and small saphenous vein on the left.  I have recommended staged bilateral treatment. I would recommend great saphenous vein ablation on the right. He did have reflux in his right small right small saphenous vein by his former duplex but on imaging this the vein is relatively small and I would recommend observation only regarding his right small saphenous vein. I would repeat recommend ablation of his left small saphenous vein due to severe reflux and marked dilatation. I did explain that this is an outpatient procedure under local anesthesia lasting approximately 1 hour. I recommend that we would stop his Xaralto 02 days prior to the procedure and resuming today following. Did explain the very slight risk of DVT with the procedure. He wishes  to proceed as soon as possible.

## 2014-09-17 ENCOUNTER — Other Ambulatory Visit: Payer: Self-pay | Admitting: *Deleted

## 2014-09-17 DIAGNOSIS — I83013 Varicose veins of right lower extremity with ulcer of ankle: Secondary | ICD-10-CM

## 2014-09-17 DIAGNOSIS — L97319 Non-pressure chronic ulcer of right ankle with unspecified severity: Principal | ICD-10-CM

## 2014-10-09 ENCOUNTER — Encounter: Payer: Self-pay | Admitting: Vascular Surgery

## 2014-10-10 ENCOUNTER — Ambulatory Visit (INDEPENDENT_AMBULATORY_CARE_PROVIDER_SITE_OTHER): Payer: 59 | Admitting: Vascular Surgery

## 2014-10-10 ENCOUNTER — Encounter: Payer: Self-pay | Admitting: Vascular Surgery

## 2014-10-10 VITALS — BP 130/81 | HR 80 | Resp 18 | Ht 70.0 in | Wt 266.0 lb

## 2014-10-10 DIAGNOSIS — I83011 Varicose veins of right lower extremity with ulcer of thigh: Secondary | ICD-10-CM

## 2014-10-10 DIAGNOSIS — I83014 Varicose veins of right lower extremity with ulcer of heel and midfoot: Secondary | ICD-10-CM

## 2014-10-10 DIAGNOSIS — L97909 Non-pressure chronic ulcer of unspecified part of unspecified lower leg with unspecified severity: Secondary | ICD-10-CM

## 2014-10-10 DIAGNOSIS — I83013 Varicose veins of right lower extremity with ulcer of ankle: Secondary | ICD-10-CM

## 2014-10-10 DIAGNOSIS — L97919 Non-pressure chronic ulcer of unspecified part of right lower leg with unspecified severity: Principal | ICD-10-CM

## 2014-10-10 DIAGNOSIS — I83012 Varicose veins of right lower extremity with ulcer of calf: Secondary | ICD-10-CM

## 2014-10-10 DIAGNOSIS — I83018 Varicose veins of right lower extremity with ulcer other part of lower leg: Secondary | ICD-10-CM

## 2014-10-10 DIAGNOSIS — I83019 Varicose veins of right lower extremity with ulcer of unspecified site: Secondary | ICD-10-CM

## 2014-10-10 DIAGNOSIS — I83015 Varicose veins of right lower extremity with ulcer other part of foot: Secondary | ICD-10-CM

## 2014-10-10 DIAGNOSIS — I83009 Varicose veins of unspecified lower extremity with ulcer of unspecified site: Secondary | ICD-10-CM | POA: Insufficient documentation

## 2014-10-10 HISTORY — PX: ENDOVENOUS ABLATION SAPHENOUS VEIN W/ LASER: SUR449

## 2014-10-10 NOTE — Progress Notes (Signed)
     Laser Ablation Procedure    Date: 10/10/2014   MALVIN MORRISH DOB:1960/11/16  Consent signed: Yes    Surgeon:  Dr. Sherren Mocha Early  Procedure: Laser Ablation: right Greater Saphenous Vein  BP 130/81 mmHg  Pulse 80  Resp 18  Ht 5\' 10"  (1.778 m)  Wt 266 lb (120.657 kg)  BMI 38.17 kg/m2  Tumescent Anesthesia: 375 cc 0.9% NaCl with 50 cc Lidocaine HCL with 1% Epi and 15 cc 8.4% NaHCO3  Local Anesthesia: 4 cc Lidocaine HCL and NaHCO3 (ratio 2:1)  15 watts continuous mode        Total energy: 2474 Joules   Total time: 2: 43      Patient tolerated procedure well    Description of Procedure:  After marking the course of the secondary varicosities, the patient was placed on the operating table in the supine position, and the right leg was prepped and draped in sterile fashion.   Local anesthetic was administered and under ultrasound guidance the saphenous vein was accessed with a micro needle and guide wire; then the mirco puncture sheath was placed.  A guide wire was inserted saphenofemoral junction , followed by a 5 french sheath.  The position of the sheath and then the laser fiber below the junction was confirmed using the ultrasound.  Tumescent anesthesia was administered along the course of the saphenous vein using ultrasound guidance. The patient was placed in Trendelenburg position and protective laser glasses were placed on patient and staff, and the laser was fired at 15 watts continuous mode advancing 1-79mm/second for a total of 2474 joules.      Steri strips were applied to the stab wounds and ABD pads and thigh high compression stockings were applied.  Ace wrap bandages were applied over the phlebectomy sites and at the top of the saphenofemoral junction. Blood loss was less than 15 cc.  The patient ambulated out of the operating room having tolerated the procedure well.  Patient had the successful ablation from proximal calf to saphenofemoral junction. There was some  web like stenosis from old thrombus in the mid thigh and therefore had 2 separate laser fibers of on the proximal portion and distal portion of his great saphenous vein.

## 2014-10-15 ENCOUNTER — Telehealth: Payer: Self-pay | Admitting: *Deleted

## 2014-10-15 NOTE — Telephone Encounter (Signed)
    10/15/2014  Time: 9:06 AM   Patient Name: Dalton Ochoa  Patient of: T.F. Early  Procedure:Laser Ablation right greater saphenous vein 10-10-2014  Reached patient at home and checked  His status  Yes    Comments/Actions Taken Mr. [name] states no problems with right leg pain or swelling.   Reviewed post procedural instructions with Mr. [name] and reminded him of post LA duplex and VV FU with Dr. Donnetta Hutching on 10-17-2014.       @SIGNATURE @

## 2014-10-16 ENCOUNTER — Encounter: Payer: Self-pay | Admitting: Vascular Surgery

## 2014-10-17 ENCOUNTER — Encounter: Payer: Self-pay | Admitting: Vascular Surgery

## 2014-10-17 ENCOUNTER — Ambulatory Visit (INDEPENDENT_AMBULATORY_CARE_PROVIDER_SITE_OTHER): Payer: 59 | Admitting: Vascular Surgery

## 2014-10-17 ENCOUNTER — Ambulatory Visit (HOSPITAL_COMMUNITY)
Admission: RE | Admit: 2014-10-17 | Discharge: 2014-10-17 | Disposition: A | Discharge status: Home / Self Care | Payer: 59 | Source: Ambulatory Visit | Attending: Vascular Surgery | Admitting: Vascular Surgery

## 2014-10-17 VITALS — BP 122/75 | HR 76 | Resp 18 | Ht 70.0 in | Wt 266.0 lb

## 2014-10-17 DIAGNOSIS — I83013 Varicose veins of right lower extremity with ulcer of ankle: Secondary | ICD-10-CM

## 2014-10-17 DIAGNOSIS — I83018 Varicose veins of right lower extremity with ulcer other part of lower leg: Secondary | ICD-10-CM

## 2014-10-17 DIAGNOSIS — I83015 Varicose veins of right lower extremity with ulcer other part of foot: Secondary | ICD-10-CM

## 2014-10-17 DIAGNOSIS — I83011 Varicose veins of right lower extremity with ulcer of thigh: Secondary | ICD-10-CM | POA: Diagnosis not present

## 2014-10-17 DIAGNOSIS — Z48812 Encounter for surgical aftercare following surgery on the circulatory system: Secondary | ICD-10-CM | POA: Diagnosis not present

## 2014-10-17 DIAGNOSIS — I83014 Varicose veins of right lower extremity with ulcer of heel and midfoot: Secondary | ICD-10-CM | POA: Diagnosis not present

## 2014-10-17 DIAGNOSIS — I83012 Varicose veins of right lower extremity with ulcer of calf: Secondary | ICD-10-CM

## 2014-10-17 DIAGNOSIS — L97919 Non-pressure chronic ulcer of unspecified part of right lower leg with unspecified severity: Principal | ICD-10-CM

## 2014-10-17 DIAGNOSIS — L97319 Non-pressure chronic ulcer of right ankle with unspecified severity: Secondary | ICD-10-CM

## 2014-10-17 DIAGNOSIS — I83019 Varicose veins of right lower extremity with ulcer of unspecified site: Secondary | ICD-10-CM

## 2014-10-17 NOTE — Progress Notes (Signed)
Patient reports today for follow-up of his laser ablation of right great saphenous vein one week ago. He reports no bruising and mild discomfort associated with this. He has been compliant with his graduated compression garments.  On physical exam his insertion sites are healing without any evidence of skin irritation. Has mild erythema over the medial aspect of his thigh.  Venous duplex today shows closure of his great saphenous vein from the distal insertion with no evidence of DVT  Impression and plan successful ablation of right great saphenous vein for severe venous reflux. Is scheduled for left small saphenous vein ablation at his convenience. We will schedule this with him and he understands that similar procedure in the prone versus supine position.

## 2014-10-23 ENCOUNTER — Other Ambulatory Visit: Payer: Self-pay | Admitting: *Deleted

## 2014-10-23 DIAGNOSIS — I83892 Varicose veins of left lower extremities with other complications: Secondary | ICD-10-CM

## 2014-12-04 ENCOUNTER — Encounter: Payer: Self-pay | Admitting: Vascular Surgery

## 2014-12-05 ENCOUNTER — Ambulatory Visit (INDEPENDENT_AMBULATORY_CARE_PROVIDER_SITE_OTHER): Payer: 59 | Admitting: Vascular Surgery

## 2014-12-05 ENCOUNTER — Encounter: Payer: Self-pay | Admitting: Vascular Surgery

## 2014-12-05 VITALS — BP 130/76 | HR 72 | Resp 18 | Ht 70.0 in | Wt 266.0 lb

## 2014-12-05 DIAGNOSIS — L97919 Non-pressure chronic ulcer of unspecified part of right lower leg with unspecified severity: Principal | ICD-10-CM

## 2014-12-05 DIAGNOSIS — I83012 Varicose veins of right lower extremity with ulcer of calf: Secondary | ICD-10-CM | POA: Diagnosis not present

## 2014-12-05 DIAGNOSIS — I83015 Varicose veins of right lower extremity with ulcer other part of foot: Secondary | ICD-10-CM

## 2014-12-05 DIAGNOSIS — I83011 Varicose veins of right lower extremity with ulcer of thigh: Secondary | ICD-10-CM | POA: Diagnosis not present

## 2014-12-05 DIAGNOSIS — I83014 Varicose veins of right lower extremity with ulcer of heel and midfoot: Secondary | ICD-10-CM | POA: Diagnosis not present

## 2014-12-05 DIAGNOSIS — I83019 Varicose veins of right lower extremity with ulcer of unspecified site: Secondary | ICD-10-CM

## 2014-12-05 DIAGNOSIS — I83018 Varicose veins of right lower extremity with ulcer other part of lower leg: Secondary | ICD-10-CM

## 2014-12-05 DIAGNOSIS — I83013 Varicose veins of right lower extremity with ulcer of ankle: Secondary | ICD-10-CM | POA: Diagnosis not present

## 2014-12-05 HISTORY — PX: ENDOVENOUS ABLATION SAPHENOUS VEIN W/ LASER: SUR449

## 2014-12-05 NOTE — Progress Notes (Signed)
     Laser Ablation Procedure    Date: 12/05/2014   Dalton Ochoa DOB:[birth year]  Consent signed: Yes    Surgeon:  Dr. Sherren Mocha Early  Procedure: Laser Ablation: left Small Saphenous Vein  BP 130/76 mmHg  Pulse 72  Resp 18  Ht 5\' 10"  (1.778 m)  Wt 266 lb (120.657 kg)  BMI 38.17 kg/m2  Tumescent Anesthesia: 425 cc 0.9% NaCl with 50 cc Lidocaine HCL with 1% Epi and 15 cc 8.4% NaHCO3  Local Anesthesia: 2 cc Lidocaine HCL and NaHCO3 (ratio 2:1)  15 watts continuous mode        Total energy: 1890 JOULES   Total time: 2:06      Patient tolerated procedure well    Description of Procedure:  After marking the course of the secondary varicosities, the patient was placed on the operating table in the prone position, and the left leg was prepped and draped in sterile fashion.   Local anesthetic was administered and under ultrasound guidance the saphenous vein was accessed with a micro needle and guide wire; then the mirco puncture sheath was placed.  A guide wire was inserted saphenopopliteal junction , followed by a 5 french sheath.  The position of the sheath and then the laser fiber below the junction was confirmed using the ultrasound.  Tumescent anesthesia was administered along the course of the saphenous vein using ultrasound guidance. The patient was placed in Trendelenburg position and protective laser glasses were placed on patient and staff, and the laser was fired at 15 watts continuous mode advancing 1-93mm/second for a total of 1890 joules.    Steri strips were applied and ABD pads and thigh high compression stockings were applied.  Ace wrap bandages were applied at the top of the saphenopopliteal junction. Blood loss was less than 15 cc.  The patient ambulated out of the operating room having tolerated the procedure well.  Uneventful ablation of left small saphenous vein. Has marked changes of venous hypertension. Ablation from the area at this junction the distal middle  third of his calf up to just below the saphenofemoral popliteal junction. Will be seen again in one week

## 2014-12-06 ENCOUNTER — Telehealth: Payer: Self-pay | Admitting: *Deleted

## 2014-12-06 ENCOUNTER — Encounter: Payer: Self-pay | Admitting: Vascular Surgery

## 2014-12-06 NOTE — Telephone Encounter (Signed)
    12/06/2014  Time: 9:45 AM   Patient Name: Dalton Ochoa  Patient of: T.F. Early  Procedure:Laser Ablation left small saphenous vein 12-05-2014  Reached patient at home and checked  His status  Yes    Comments/Actions Taken: Mr. Grillo states no left leg swelling and mild discomfort posterior left calf (area that was treated ).  Reviewed post procedural instructions with him and reminded him of post laser ablation duplex and VV FU with Dr. Donnetta Hutching on 12-12-2014.       @SIGNATURE @

## 2014-12-10 ENCOUNTER — Encounter: Payer: Self-pay | Admitting: Vascular Surgery

## 2014-12-12 ENCOUNTER — Ambulatory Visit (INDEPENDENT_AMBULATORY_CARE_PROVIDER_SITE_OTHER): Payer: 59 | Admitting: Vascular Surgery

## 2014-12-12 ENCOUNTER — Ambulatory Visit (HOSPITAL_COMMUNITY)
Admission: RE | Admit: 2014-12-12 | Discharge: 2014-12-12 | Disposition: A | Discharge status: Home / Self Care | Payer: 59 | Source: Ambulatory Visit | Attending: Vascular Surgery | Admitting: Vascular Surgery

## 2014-12-12 ENCOUNTER — Encounter: Payer: Self-pay | Admitting: Vascular Surgery

## 2014-12-12 VITALS — BP 139/78 | HR 88 | Resp 16 | Ht 71.0 in | Wt 260.0 lb

## 2014-12-12 DIAGNOSIS — I83892 Varicose veins of left lower extremities with other complications: Secondary | ICD-10-CM | POA: Insufficient documentation

## 2014-12-12 DIAGNOSIS — I83018 Varicose veins of right lower extremity with ulcer other part of lower leg: Secondary | ICD-10-CM

## 2014-12-12 DIAGNOSIS — I83013 Varicose veins of right lower extremity with ulcer of ankle: Secondary | ICD-10-CM

## 2014-12-12 DIAGNOSIS — Z48812 Encounter for surgical aftercare following surgery on the circulatory system: Secondary | ICD-10-CM | POA: Diagnosis present

## 2014-12-12 DIAGNOSIS — L97919 Non-pressure chronic ulcer of unspecified part of right lower leg with unspecified severity: Principal | ICD-10-CM

## 2014-12-12 DIAGNOSIS — I83015 Varicose veins of right lower extremity with ulcer other part of foot: Secondary | ICD-10-CM

## 2014-12-12 DIAGNOSIS — I83011 Varicose veins of right lower extremity with ulcer of thigh: Secondary | ICD-10-CM | POA: Diagnosis not present

## 2014-12-12 DIAGNOSIS — I83014 Varicose veins of right lower extremity with ulcer of heel and midfoot: Secondary | ICD-10-CM

## 2014-12-12 DIAGNOSIS — I83012 Varicose veins of right lower extremity with ulcer of calf: Secondary | ICD-10-CM | POA: Diagnosis not present

## 2014-12-12 DIAGNOSIS — I83019 Varicose veins of right lower extremity with ulcer of unspecified site: Secondary | ICD-10-CM

## 2014-12-12 NOTE — Progress Notes (Signed)
Patient presents today for follow-up of his laser ablation of small saphenous vein on his left leg 1 week ago. Underwent similar treatment of right great saphenous vein in March. He reports typical amount of induration and discomfort over the ablation site. Is been compliant with his compression garment.  Past Medical History  Diagnosis Date  . Venous stasis   . Diabetes mellitus without complication   . Hypertension   . Gout   . DVT (deep venous thrombosis)   . Varicose veins     History  Substance Use Topics  . Smoking status: Never Smoker   . Smokeless tobacco: Never Used  . Alcohol Use: 0.0 oz/week    0 Standard drinks or equivalent per week     Comment: occ    Family History  Problem Relation Age of Onset  . Deep vein thrombosis Mother   . Hypertension Mother   . Diabetes Father     No Known Allergies   Current outpatient prescriptions:  .  ALLOPURINOL PO, Take 300 mg by mouth daily. , Disp: , Rfl:  .  BENAZEPRIL HCL PO, Take 20 mg by mouth daily. , Disp: , Rfl:  .  colchicine 0.6 MG tablet, Take 0.6 mg by mouth 2 (two) times daily., Disp: , Rfl:  .  furosemide (LASIX) 20 MG tablet, Take 20 mg by mouth daily., Disp: , Rfl:  .  METFORMIN HCL PO, Take 500 mg by mouth 2 (two) times daily. , Disp: , Rfl:  .  pravastatin (PRAVACHOL) 20 MG tablet, Take 20 mg by mouth daily., Disp: , Rfl:  .  Rivaroxaban (XARELTO PO), Take 20 mg by mouth daily. , Disp: , Rfl:   Filed Vitals:   12/12/14 0943  BP: 139/78  Pulse: 88  Resp: 16  Height: 5\' 11"  (1.803 m)  Weight: 260 lb (117.935 kg)    Body mass index is 36.28 kg/(m^2).       Physical exam he does have some induration and thickness. There is no skin breakdown. He has resolved the discomfort over his right medial thigh from his great saphenous vein ablation 6 weeks ago.  I did review his duplex today with the patient. This shows successful closure of his small saphenous vein on the left from the distal calf to the  junction with the popliteal vein. There is no evidence of DVT.  I also reviewed his initial duplex with the patient. This did not show any significant reflux in the left great saphenous vein. There was a slight amount of reflux at the common femoral vein and saphenofemoral junction but not throughout the saphenous vein itself. He does have reflux throughout the common femoral vein, femoral vein, popliteal vein. On the right leg he did have some reflux with moderate dilatation and his small saphenous vein. Diameter was not extensive and therefore I did not recommend treatment of his right small saphenous vein at this time.  Explained that his chronic venous hypertension is related to deep and superficial system. We have corrected the refluxing segments of the right and left. Understands importance of continued the lifelong compression bilaterally. He will notify us should he develop any progressive changes.

## 2016-09-09 ENCOUNTER — Encounter: Payer: Self-pay | Admitting: Neurology

## 2016-09-09 ENCOUNTER — Ambulatory Visit (INDEPENDENT_AMBULATORY_CARE_PROVIDER_SITE_OTHER): Payer: 59 | Admitting: Neurology

## 2016-09-09 DIAGNOSIS — R251 Tremor, unspecified: Secondary | ICD-10-CM | POA: Diagnosis not present

## 2016-09-09 NOTE — Progress Notes (Signed)
Reason for visit: Tremor  Referring physician: Dr. Cyndie Chime Dalton Ochoa is a 56 y.o. male  History of present illness:  Dalton Ochoa is a 56 year old right-handed black male with a history of a tremor that developed in the right hand about one year ago. The patient has not had significant progression of the tremor, and the tremor is most notable when he is holding an object in his hand and then pronates the forearm. The tremor becomes quite prominent at that time. The patient generally does not note a resting tremor whatsoever. He has not had much of an issue with the left arm. He has noted that feeding himself maybe result in a tremor, and he has difficulty forming tasks that require fine motor control such as using a screwdriver. The patient denies any numbness or weakness of extremities, he does not have a prominent family history of tremor, but his mother does have a tremor involving the tongue, but she is 54 years old. The patient denies any balance issues or difficulty controlling the bowels or the bladder. He has not noted any tremor of the lower extremities or with head or neck. He is sent to this office for further evaluation.  Past Medical History:  Diagnosis Date  . Diabetes mellitus without complication ([place])   . DVT (deep venous thrombosis) ([place])   . Gout   . Hypertension   . Varicose veins   . Venous stasis     Past Surgical History:  Procedure Laterality Date  . ENDOVENOUS ABLATION SAPHENOUS VEIN W/ LASER Right 10-10-2014   EVLA right greater saphenous vein by Curt Jews MD  . ENDOVENOUS ABLATION SAPHENOUS VEIN W/ LASER Left 12-05-2014   endovenous laser ablation left small saphenous vein by Curt Jews MD    Family History  Problem Relation Age of Onset  . Deep vein thrombosis Mother   . Hypertension Mother   . Diabetes Father     Social history:  reports that he has never smoked. He has never used smokeless tobacco. He reports that he drinks alcohol. He  reports that he does not use drugs.  Medications:  Prior to Admission medications   Medication Sig Start Date End Date Taking? Authorizing Provider  BENAZEPRIL HCL PO Take 20 mg by mouth daily.    Yes Historical Provider, MD  furosemide (LASIX) 20 MG tablet Take 20 mg by mouth daily.   Yes Historical Provider, MD  METFORMIN HCL PO Take 500 mg by mouth 2 (two) times daily.    Yes Historical Provider, MD  pravastatin (PRAVACHOL) 20 MG tablet Take 20 mg by mouth daily.   Yes Historical Provider, MD  Rivaroxaban (XARELTO PO) Take 20 mg by mouth daily.    Yes Historical Provider, MD     No Known Allergies  ROS:  Out of a complete 14 system review of symptoms, the patient complains only of the following symptoms, and all other reviewed systems are negative.  Shift work Tremor  Blood pressure 126/73, pulse (!) 55, height 5\' 11"  (1.803 m), weight 219 lb 8 oz (99.6 kg).  Physical Exam  General: The patient is alert and cooperative at the time of the examination.  Eyes: Pupils are equal, round, and reactive to light. Discs are flat bilaterally.  Neck: The neck is supple, no carotid bruits are noted.  Respiratory: The respiratory examination is clear.  Cardiovascular: The cardiovascular examination reveals a regular rate and rhythm, no obvious murmurs or rubs are noted.  Skin:  Extremities are without significant edema.  Neurologic Exam  Mental status: The patient is alert and oriented x 3 at the time of the examination. The patient has apparent normal recent and remote memory, with an apparently normal attention span and concentration ability.  Cranial nerves: Facial symmetry is present. There is good sensation of the face to pinprick and soft touch bilaterally. The strength of the facial muscles and the muscles to head turning and shoulder shrug are normal bilaterally. Speech is well enunciated, no aphasia or dysarthria is noted. Extraocular movements are full. Visual fields are full.  The tongue is midline, and the patient has symmetric elevation of the soft palate. No obvious hearing deficits are noted.  Motor: The motor testing reveals 5 over 5 strength of all 4 extremities. Good symmetric motor tone is noted throughout.  Sensory: Sensory testing is intact to pinprick, soft touch, vibration sensation, and position sense on all 4 extremities. No evidence of extinction is noted.  Coordination: Cerebellar testing reveals good finger-nose-finger and heel-to-shin bilaterally. The patient does demonstrate a tremor with pronation of the right arm when holding an object. Minimal tremors noted with the left upper extremity.  Gait and station: Gait is normal. Tandem gait is normal. Romberg is negative. No drift is seen.  Reflexes: Deep tendon reflexes are symmetric and normal bilaterally. Toes are downgoing bilaterally.   Assessment/Plan:  1. Right arm tremor  The patient may have an early essential tremor, but the patient does have risk factors for cerebrovascular disease. The patient will undergo MRI of the brain with and without gadolinium enhancement, and he will have blood work today. He will follow-up if needed in the future if he desires treatment for the tremor.   Dalton Alexanders MD 09/09/2016 9:45 AM  [facility] [address] [place], [place] [address]  Phone [phone] Fax [phone]

## 2016-09-10 LAB — COMPREHENSIVE METABOLIC PANEL
ALT: 23 IU/L (ref 0–44)
AST: 29 IU/L (ref 0–40)
Albumin/Globulin Ratio: 1.4 (ref 1.2–2.2)
Albumin: 4.6 g/dL (ref 3.5–5.5)
Alkaline Phosphatase: 84 IU/L (ref 39–117)
BUN / CREAT RATIO: 16 (ref 9–20)
BUN: 21 mg/dL (ref 6–24)
Bilirubin Total: 0.7 mg/dL (ref 0.0–1.2)
CO2: 20 mmol/L (ref 18–29)
CREATININE: 1.35 mg/dL — AB (ref 0.76–1.27)
Calcium: 9.5 mg/dL (ref 8.7–10.2)
Chloride: 99 mmol/L (ref 96–106)
GFR calc non Af Amer: 59 mL/min/{1.73_m2} — ABNORMAL LOW (ref >59)
GFR, EST AFRICAN AMERICAN: 68 mL/min/{1.73_m2} (ref >59)
GLOBULIN, TOTAL: 3.2 g/dL (ref 1.5–4.5)
Glucose: 102 mg/dL — ABNORMAL HIGH (ref 65–99)
POTASSIUM: 4.3 mmol/L (ref 3.5–5.2)
SODIUM: 140 mmol/L (ref 134–144)
TOTAL PROTEIN: 7.8 g/dL (ref 6.0–8.5)

## 2016-09-10 LAB — TSH: TSH: 1.01 u[IU]/mL (ref 0.450–4.500)

## 2016-10-27 ENCOUNTER — Ambulatory Visit
Admission: RE | Admit: 2016-10-27 | Discharge: 2016-10-27 | Disposition: A | Discharge status: Home / Self Care | Payer: 59 | Source: Ambulatory Visit | Attending: Neurology | Admitting: Neurology

## 2016-10-27 DIAGNOSIS — R251 Tremor, unspecified: Secondary | ICD-10-CM

## 2016-10-27 MED ORDER — GADOBENATE DIMEGLUMINE 529 MG/ML IV SOLN
20.0000 mL | Freq: Once | INTRAVENOUS | Status: AC | PRN
  Administered 2016-10-27: 20 mL via INTRAVENOUS

## 2016-10-29 ENCOUNTER — Telehealth: Payer: Self-pay | Admitting: Neurology

## 2016-10-29 NOTE — Telephone Encounter (Signed)
I called patient. The MRI the brain shows mild to moderate small vessel ischemic changes, the patient does have diabetes, hypertension, and a history of DVT he is on Xarelto.  I suppose it is possible that the small vessel changes may have some to do with his tremor, it is not certain, however. The patient appeared to have a well-controlled blood pressure when he was seen in office. If he ever does come off of Xarelto, he should be on aspirin therapy.   MRI brain 10/28/16:  IMPRESSION:  Mildly abnormal MRI brain (with and without) demonstrating: 1. Mild scattered periventricular and subcortical and juxtacortical foci of non-specific gliosis.  2. No abnormal lesions are seen on post contrast views.   3. No acute findings.

## 2017-08-05 ENCOUNTER — Other Ambulatory Visit: Payer: Self-pay | Admitting: Urology

## 2017-08-22 ENCOUNTER — Encounter (HOSPITAL_BASED_OUTPATIENT_CLINIC_OR_DEPARTMENT_OTHER): Payer: Self-pay

## 2017-08-22 ENCOUNTER — Other Ambulatory Visit: Payer: Self-pay

## 2017-08-22 NOTE — Progress Notes (Signed)
Spoke with:  Dalton Ochoa NPO:  After Midnight, no gum, candy, or mints   Arrival time: 5:30AM Labs: Istat 8, EKG, CBG AM medications: Pravastatin Pre op orders: Yes Ride home: Lilyan Punt (wife) [phone]

## 2017-09-01 NOTE — Anesthesia Preprocedure Evaluation (Addendum)
Anesthesia Evaluation  Patient identified by MRN, date of birth, ID band Patient awake    Reviewed: Allergy & Precautions, NPO status , Patient's Chart, lab work & pertinent test results  Airway Mallampati: II  TM Distance: >3 FB Neck ROM: Full    Dental  (+) Dental Advisory Given, Teeth Intact   Pulmonary PE   Pulmonary exam normal breath sounds clear to auscultation       Cardiovascular hypertension, Pt. on medications + DVT  Normal cardiovascular exam Rhythm:Regular Rate:Normal     Neuro/Psych Occasional tremors in right hand negative psych ROS   GI/Hepatic negative GI ROS, Neg liver ROS,   Endo/Other  diabetes, Well Controlled, Type 2, Oral Hypoglycemic AgentsObesity  Renal/GU negative Renal ROS  negative genitourinary   Musculoskeletal Gout   Abdominal   Peds  Hematology negative hematology ROS (+)   Anesthesia Other Findings   Reproductive/Obstetrics                            Anesthesia Physical Anesthesia Plan  ASA: II  Anesthesia Plan: General   Post-op Pain Management:    Induction: Intravenous  PONV Risk Score and Plan: 3 and Treatment may vary due to age or medical condition, Midazolam, Dexamethasone and Ondansetron  Airway Management Planned: LMA  Additional Equipment: None  Intra-op Plan:   Post-operative Plan: Extubation in OR  Informed Consent: I have reviewed the patients History and Physical, chart, labs and discussed the procedure including the risks, benefits and alternatives for the proposed anesthesia with the patient or authorized representative who has indicated his/her understanding and acceptance.   Dental advisory given  Plan Discussed with: CRNA  Anesthesia Plan Comments:         Anesthesia Quick Evaluation

## 2017-09-02 ENCOUNTER — Ambulatory Visit (HOSPITAL_BASED_OUTPATIENT_CLINIC_OR_DEPARTMENT_OTHER): Payer: Managed Care, Other (non HMO) | Admitting: Anesthesiology

## 2017-09-02 ENCOUNTER — Encounter (HOSPITAL_BASED_OUTPATIENT_CLINIC_OR_DEPARTMENT_OTHER)
Admission: RE | Disposition: A | Discharge status: Home / Self Care | Payer: Self-pay | Source: Ambulatory Visit | Attending: Urology

## 2017-09-02 ENCOUNTER — Encounter (HOSPITAL_BASED_OUTPATIENT_CLINIC_OR_DEPARTMENT_OTHER): Payer: Self-pay

## 2017-09-02 ENCOUNTER — Ambulatory Visit (HOSPITAL_BASED_OUTPATIENT_CLINIC_OR_DEPARTMENT_OTHER)
Admission: RE | Admit: 2017-09-02 | Discharge: 2017-09-02 | Disposition: A | Discharge status: Home / Self Care | Payer: Managed Care, Other (non HMO) | Source: Ambulatory Visit | Attending: Urology | Admitting: Urology

## 2017-09-02 ENCOUNTER — Other Ambulatory Visit: Payer: Self-pay

## 2017-09-02 DIAGNOSIS — I10 Essential (primary) hypertension: Secondary | ICD-10-CM | POA: Insufficient documentation

## 2017-09-02 DIAGNOSIS — N529 Male erectile dysfunction, unspecified: Secondary | ICD-10-CM | POA: Diagnosis not present

## 2017-09-02 DIAGNOSIS — Z86711 Personal history of pulmonary embolism: Secondary | ICD-10-CM | POA: Insufficient documentation

## 2017-09-02 DIAGNOSIS — Z7984 Long term (current) use of oral hypoglycemic drugs: Secondary | ICD-10-CM | POA: Insufficient documentation

## 2017-09-02 DIAGNOSIS — Z86718 Personal history of other venous thrombosis and embolism: Secondary | ICD-10-CM | POA: Insufficient documentation

## 2017-09-02 DIAGNOSIS — N486 Induration penis plastica: Secondary | ICD-10-CM | POA: Diagnosis present

## 2017-09-02 DIAGNOSIS — E119 Type 2 diabetes mellitus without complications: Secondary | ICD-10-CM | POA: Insufficient documentation

## 2017-09-02 DIAGNOSIS — M109 Gout, unspecified: Secondary | ICD-10-CM | POA: Diagnosis not present

## 2017-09-02 DIAGNOSIS — Z7901 Long term (current) use of anticoagulants: Secondary | ICD-10-CM | POA: Insufficient documentation

## 2017-09-02 HISTORY — PX: NESBIT PROCEDURE: SHX2087

## 2017-09-02 HISTORY — DX: Tremor, unspecified: R25.1

## 2017-09-02 HISTORY — DX: Unilateral inguinal hernia, without obstruction or gangrene, not specified as recurrent: K40.90

## 2017-09-02 HISTORY — DX: Other pulmonary embolism without acute cor pulmonale: I26.99

## 2017-09-02 LAB — POCT I-STAT, CHEM 8
BUN: 21 mg/dL — AB (ref 6–20)
CALCIUM ION: 1.23 mmol/L (ref 1.15–1.40)
CHLORIDE: 100 mmol/L — AB (ref 101–111)
Creatinine, Ser: 1.3 mg/dL — ABNORMAL HIGH (ref 0.61–1.24)
Glucose, Bld: 104 mg/dL — ABNORMAL HIGH (ref 65–99)
HCT: 53 % — ABNORMAL HIGH (ref 39.0–52.0)
Hemoglobin: 18 g/dL — ABNORMAL HIGH (ref 13.0–17.0)
POTASSIUM: 4 mmol/L (ref 3.5–5.1)
SODIUM: 141 mmol/L (ref 135–145)
TCO2: 27 mmol/L (ref 22–32)

## 2017-09-02 LAB — GLUCOSE, CAPILLARY: Glucose-Capillary: 119 mg/dL — ABNORMAL HIGH (ref 65–99)

## 2017-09-02 SURGERY — NESBIT PROCEDURE
Anesthesia: General

## 2017-09-02 MED ORDER — CEFAZOLIN SODIUM-DEXTROSE 2-4 GM/100ML-% IV SOLN
2.0000 g | INTRAVENOUS | Status: AC
  Administered 2017-09-02: 2 g via INTRAVENOUS
  Filled 2017-09-02: qty 100

## 2017-09-02 MED ORDER — LACTATED RINGERS IV SOLN
INTRAVENOUS | Status: DC
  Administered 2017-09-02 (×3): via INTRAVENOUS
  Filled 2017-09-02: qty 1000

## 2017-09-02 MED ORDER — FENTANYL CITRATE (PF) 100 MCG/2ML IJ SOLN
INTRAMUSCULAR | Status: AC
  Filled 2017-09-02: qty 2

## 2017-09-02 MED ORDER — SUCCINYLCHOLINE CHLORIDE 200 MG/10ML IV SOSY
PREFILLED_SYRINGE | INTRAVENOUS | Status: AC
Start: 2017-09-02 — End: 2017-09-02
  Filled 2017-09-02: qty 10

## 2017-09-02 MED ORDER — PHENYLEPHRINE 200 MCG/ML (NON-ED) FOR PRIAPISM
200.0000 ug | Freq: Once | INTRAMUSCULAR | Status: DC
  Filled 2017-09-02: qty 10

## 2017-09-02 MED ORDER — LIDOCAINE HCL (CARDIAC) 20 MG/ML IV SOLN
INTRAVENOUS | Status: DC | PRN
  Administered 2017-09-02: 100 mg via INTRAVENOUS

## 2017-09-02 MED ORDER — FENTANYL CITRATE (PF) 100 MCG/2ML IJ SOLN
25.0000 ug | INTRAMUSCULAR | Status: DC | PRN
  Filled 2017-09-02: qty 1

## 2017-09-02 MED ORDER — OXYCODONE HCL 5 MG/5ML PO SOLN
5.0000 mg | Freq: Once | ORAL | Status: DC | PRN
  Filled 2017-09-02: qty 5

## 2017-09-02 MED ORDER — OXYCODONE HCL 5 MG PO TABS
5.0000 mg | ORAL_TABLET | Freq: Once | ORAL | Status: DC | PRN
  Filled 2017-09-02: qty 1

## 2017-09-02 MED ORDER — DEXAMETHASONE SODIUM PHOSPHATE 4 MG/ML IJ SOLN
INTRAMUSCULAR | Status: DC | PRN
  Administered 2017-09-02: 10 mg via INTRAVENOUS

## 2017-09-02 MED ORDER — ONDANSETRON HCL 4 MG/2ML IJ SOLN
INTRAMUSCULAR | Status: DC | PRN
  Administered 2017-09-02: 4 mg via INTRAVENOUS

## 2017-09-02 MED ORDER — OXYCODONE-ACETAMINOPHEN 5-325 MG PO TABS: 1.0000 | ORAL_TABLET | Freq: Four times a day (QID) | ORAL | 0 refills | Status: DC | PRN

## 2017-09-02 MED ORDER — PROMETHAZINE HCL 25 MG/ML IJ SOLN
6.2500 mg | INTRAMUSCULAR | Status: DC | PRN
  Filled 2017-09-02: qty 1

## 2017-09-02 MED ORDER — BUPIVACAINE HCL (PF) 0.25 % IJ SOLN
INTRAMUSCULAR | Status: DC | PRN
  Administered 2017-09-02: 10 mL

## 2017-09-02 MED ORDER — CEFAZOLIN SODIUM-DEXTROSE 2-4 GM/100ML-% IV SOLN
INTRAVENOUS | Status: AC
  Filled 2017-09-02: qty 100

## 2017-09-02 MED ORDER — ONDANSETRON HCL 4 MG/2ML IJ SOLN
INTRAMUSCULAR | Status: AC
  Filled 2017-09-02: qty 2

## 2017-09-02 MED ORDER — FENTANYL CITRATE (PF) 100 MCG/2ML IJ SOLN
INTRAMUSCULAR | Status: DC | PRN
  Administered 2017-09-02 (×8): 25 ug via INTRAVENOUS

## 2017-09-02 MED ORDER — GLYCOPYRROLATE 0.2 MG/ML IV SOSY
PREFILLED_SYRINGE | INTRAVENOUS | Status: AC
  Filled 2017-09-02: qty 5

## 2017-09-02 MED ORDER — GLYCOPYRROLATE 0.2 MG/ML IJ SOLN
INTRAMUSCULAR | Status: DC | PRN
  Administered 2017-09-02: 0.2 mg via INTRAVENOUS

## 2017-09-02 MED ORDER — PROPOFOL 10 MG/ML IV BOLUS
INTRAVENOUS | Status: AC
  Filled 2017-09-02: qty 40

## 2017-09-02 MED ORDER — DEXAMETHASONE SODIUM PHOSPHATE 10 MG/ML IJ SOLN
INTRAMUSCULAR | Status: AC
  Filled 2017-09-02: qty 1

## 2017-09-02 MED ORDER — LIDOCAINE 2% (20 MG/ML) 5 ML SYRINGE
INTRAMUSCULAR | Status: AC
  Filled 2017-09-02: qty 5

## 2017-09-02 MED ORDER — PROPOFOL 10 MG/ML IV BOLUS
INTRAVENOUS | Status: DC | PRN
  Administered 2017-09-02: 200 mg via INTRAVENOUS
  Administered 2017-09-02: 100 mg via INTRAVENOUS
  Administered 2017-09-02: 80 mg via INTRAVENOUS

## 2017-09-02 MED ORDER — SENNOSIDES-DOCUSATE SODIUM 8.6-50 MG PO TABS: 1.0000 | ORAL_TABLET | Freq: Two times a day (BID) | ORAL | 0 refills | Status: DC

## 2017-09-02 MED ORDER — MIDAZOLAM HCL 2 MG/2ML IJ SOLN
INTRAMUSCULAR | Status: AC
  Filled 2017-09-02: qty 2

## 2017-09-02 MED ORDER — SODIUM CHLORIDE 0.9 % IJ SOLN
INTRAMUSCULAR | Status: DC | PRN
  Administered 2017-09-02: 200 mL

## 2017-09-02 MED ORDER — PROPOFOL 10 MG/ML IV BOLUS
INTRAVENOUS | Status: AC
  Filled 2017-09-02: qty 20

## 2017-09-02 MED ORDER — SUCCINYLCHOLINE CHLORIDE 200 MG/10ML IV SOSY
PREFILLED_SYRINGE | INTRAVENOUS | Status: DC | PRN
  Administered 2017-09-02: 100 mg via INTRAVENOUS

## 2017-09-02 MED ORDER — MIDAZOLAM HCL 5 MG/5ML IJ SOLN
INTRAMUSCULAR | Status: DC | PRN
  Administered 2017-09-02: 2 mg via INTRAVENOUS

## 2017-09-02 SURGICAL SUPPLY — 51 items
BANDAGE CO FLEX L/F 1IN X 5YD (GAUZE/BANDAGES/DRESSINGS) IMPLANT
BANDAGE CO FLEX L/F 2IN X 5YD (GAUZE/BANDAGES/DRESSINGS) IMPLANT
BANDAGE COBAN STERILE 2 (GAUZE/BANDAGES/DRESSINGS) ×3 IMPLANT
BLADE CLIPPER SURG (BLADE) ×3 IMPLANT
BLADE HEX COATED 2.75 (ELECTRODE) ×3 IMPLANT
BLADE SURG 15 STRL LF DISP TIS (BLADE) ×1 IMPLANT
BLADE SURG 15 STRL SS (BLADE) ×2
BNDG COHESIVE 3X5 TAN STRL LF (GAUZE/BANDAGES/DRESSINGS) IMPLANT
BNDG CONFORM 2 STRL LF (GAUZE/BANDAGES/DRESSINGS) IMPLANT
BNDG CONFORM 3 STRL LF (GAUZE/BANDAGES/DRESSINGS) IMPLANT
CANISTER SUCT 3000ML PPV (MISCELLANEOUS) IMPLANT
CANISTER SUCTION 1200CC (MISCELLANEOUS) ×3 IMPLANT
CATH FOLEY 2WAY SLVR  5CC 16FR (CATHETERS) ×2
CATH FOLEY 2WAY SLVR 5CC 16FR (CATHETERS) ×1 IMPLANT
CATH ROBINSON RED A/P 10FR (CATHETERS) ×3 IMPLANT
COVER BACK TABLE 60X90IN (DRAPES) ×3 IMPLANT
COVER MAYO STAND STRL (DRAPES) ×3 IMPLANT
DERMABOND ADVANCED (GAUZE/BANDAGES/DRESSINGS) ×2
DERMABOND ADVANCED .7 DNX12 (GAUZE/BANDAGES/DRESSINGS) ×1 IMPLANT
DRAIN PENROSE 18X1/2 LTX STRL (DRAIN) ×3 IMPLANT
DRAIN PENROSE 18X1/4 LTX STRL (WOUND CARE) ×3 IMPLANT
DRAPE LAPAROTOMY 100X72 PEDS (DRAPES) ×3 IMPLANT
ELECT REM PT RETURN 9FT ADLT (ELECTROSURGICAL) ×3
ELECTRODE REM PT RTRN 9FT ADLT (ELECTROSURGICAL) ×1 IMPLANT
GAUZE SPONGE 4X4 12PLY STRL (GAUZE/BANDAGES/DRESSINGS) ×3 IMPLANT
GAUZE SPONGE 4X4 12PLY STRL LF (GAUZE/BANDAGES/DRESSINGS) IMPLANT
GLOVE BIO SURGEON STRL SZ7.5 (GLOVE) ×3 IMPLANT
GOWN STRL REUS W/ TWL LRG LVL3 (GOWN DISPOSABLE) ×2 IMPLANT
GOWN STRL REUS W/ TWL XL LVL3 (GOWN DISPOSABLE) ×1 IMPLANT
GOWN STRL REUS W/TWL LRG LVL3 (GOWN DISPOSABLE) ×4
GOWN STRL REUS W/TWL XL LVL3 (GOWN DISPOSABLE) ×2
IV NS IRRIG 3000ML ARTHROMATIC (IV SOLUTION) IMPLANT
KIT RM TURNOVER CYSTO AR (KITS) ×3 IMPLANT
NS IRRIG 500ML POUR BTL (IV SOLUTION) ×3 IMPLANT
PACK BASIN DAY SURGERY FS (CUSTOM PROCEDURE TRAY) ×3 IMPLANT
PENCIL BUTTON HOLSTER BLD 10FT (ELECTRODE) ×3 IMPLANT
PLUG CATH AND CAP STER (CATHETERS) ×3 IMPLANT
SPONGE LAP 4X18 X RAY DECT (DISPOSABLE) IMPLANT
SUPPORT SCROTAL LG STRP (MISCELLANEOUS) IMPLANT
SUPPORTER ATHLETIC LG (MISCELLANEOUS)
SUT CHROMIC 3 0 SH 27 (SUTURE) ×3 IMPLANT
SUT ETHIBOND 2 OS 4 DA (SUTURE) ×18 IMPLANT
SUT ETHIBOND 3-0 V-5 (SUTURE) IMPLANT
SUT VIC AB 3-0 SH 27 (SUTURE) ×4
SUT VIC AB 3-0 SH 27X BRD (SUTURE) ×2 IMPLANT
SYR CONTROL 10ML LL (SYRINGE) ×3 IMPLANT
SYRINGE 10CC LL (SYRINGE) ×6 IMPLANT
SYRINGE 60CC LL (MISCELLANEOUS) ×3 IMPLANT
TOWEL OR 17X24 6PK STRL BLUE (TOWEL DISPOSABLE) ×6 IMPLANT
WATER STERILE IRR 3000ML UROMA (IV SOLUTION) IMPLANT
WATER STERILE IRR 500ML POUR (IV SOLUTION) IMPLANT

## 2017-09-02 NOTE — Anesthesia Procedure Notes (Signed)
Procedure Name: LMA Insertion Date/Time: 09/02/2017 7:35 AM Performed by: Justice Rocher, CRNA Pre-anesthesia Checklist: Patient identified, Emergency Drugs available, Suction available and Patient being monitored Patient Re-evaluated:Patient Re-evaluated prior to induction Oxygen Delivery Method: Circle system utilized Preoxygenation: Pre-oxygenation with 100% oxygen Induction Type: IV induction Ventilation: Mask ventilation without difficulty LMA: LMA inserted LMA Size: 5.0 Number of attempts: 1 Airway Equipment and Method: Bite block Placement Confirmation: positive ETCO2 and breath sounds checked- equal and bilateral Tube secured with: Tape Dental Injury: Teeth and Oropharynx as per pre-operative assessment

## 2017-09-02 NOTE — Brief Op Note (Signed)
09/02/2017  8:25 AM  PATIENT:  Dalton Ochoa  57 y.o. male  PRE-OPERATIVE DIAGNOSIS:  PEYRONIES DISEASE  POST-OPERATIVE DIAGNOSIS:  PEYRONIES DISEASE  PROCEDURE:  Procedure(s): NESBIT PROCEDURE AND BLOCK (N/A)  SURGEON:  Surgeon(s) and Role:    * [name], MD - Primary  PHYSICIAN ASSISTANT:   ASSISTANTS: none   ANESTHESIA:   local and general  EBL:  27mL  BLOOD ADMINISTERED:none  DRAINS: none   LOCAL MEDICATIONS USED:  MARCAINE     SPECIMEN:  No Specimen  DISPOSITION OF SPECIMEN:  N/A  COUNTS:  YES  TOURNIQUET:  * No tourniquets in log *  DICTATION: .Other Dictation: Dictation Number [id number]  PLAN OF CARE: Discharge to home after PACU  PATIENT DISPOSITION:  PACU - hemodynamically stable.   Delay start of Pharmacological VTE agent (>24hrs) due to surgical blood loss or risk of bleeding: yes

## 2017-09-02 NOTE — Anesthesia Postprocedure Evaluation (Signed)
Anesthesia Post Note  Patient: Dalton Ochoa  Procedure(s) Performed: NESBIT PROCEDURE AND BLOCK (N/A )     Patient location during evaluation: PACU Anesthesia Type: General Level of consciousness: awake and alert Pain management: pain level controlled Vital Signs Assessment: post-procedure vital signs reviewed and stable Respiratory status: spontaneous breathing, nonlabored ventilation and respiratory function stable Cardiovascular status: blood pressure returned to baseline and stable Postop Assessment: no apparent nausea or vomiting Anesthetic complications: no    Last Vitals:  Vitals:   09/02/17 0930 09/02/17 1010  BP: (!) 154/79 (!) 142/78  Pulse: 75 73  Resp: 11 14  Temp:  36.7 C  SpO2: 97% 98%    Last Pain:  Vitals:   09/02/17 0544  TempSrc: Oral                 Audry Pili

## 2017-09-02 NOTE — Discharge Instructions (Signed)
1 - Please remove surgical dressing on Saturday morning, you may then shower normally.  2 - No sexual stimulation until cleared by Dr. Tresa Moore. No lifting > 20 lbs x 1 week.  3  - Resume Xarelto on Monday morning  4 - Call MD or go to ER for fever >102, severe pain / nausea / vomiting not relieved by medications, or acute change in medical status   Post Anesthesia Home Care Instructions  Activity: Get plenty of rest for the remainder of the day. A responsible individual must stay with you for 24 hours following the procedure.  For the next 24 hours, DO NOT: -Drive a car -Paediatric nurse -Drink alcoholic beverages -Take any medication unless instructed by your physician -Make any legal decisions or sign important papers.  Meals: Start with liquid foods such as gelatin or soup. Progress to regular foods as tolerated. Avoid greasy, spicy, heavy foods. If nausea and/or vomiting occur, drink only clear liquids until the nausea and/or vomiting subsides. Call your physician if vomiting continues.  Special Instructions/Symptoms: Your throat may feel dry or sore from the anesthesia or the breathing tube placed in your throat during surgery. If this causes discomfort, gargle with warm salt water. The discomfort should disappear within 24 hours.

## 2017-09-02 NOTE — H&P (Signed)
Dalton Ochoa is an 57 y.o. male.    Chief Complaint: Pre-OP Penile Plication  HPI:   1 - Peyronie's Disease - Pt with new left / dorsal curvature distal shaft since aroudn 07/2016. Does not recall specific trauma. Admits is getting worse still. His eretrions are compromised but goals met with sildenafil. He is diabetic on Xarelto.   Repeat eval 07/2017 with stable about 90 degrees left curvature mid shaft,   2 - Erectile Dysfunction - pt with progressive inability to maintain erection. Can penetrate reliable, but difficult to maintain to climax. had had good result form various PDE5i in past. Starting sildenafil trial 01/2017 and works great at about 35m.   PMH sig for DM2 (no A1c for review), CHF/Lasix (very mild clinically), DVT/Xarelto, ablation Lspine for lumbago. His PCP is Dalton Ochoa with Dalton Ochoa   Today " Dalton Ochoa" is seen to proceed with penile plication. He has held Xarelto as instructed.    Past Medical History:  Diagnosis Date  . [place] complication [place]   . DVT (deep venous thrombosis) ([facility]   . Gout   . Hypertension   . Inguinal hernia   . Occasional tremors    Right hand  . Pulmonary emboli ([place] 07/21/2005  . Varicose veins   . Venous stasis     Past Surgical History:  Procedure Laterality Date  . ENDOVENOUS ABLATION SAPHENOUS VEIN W/ LASER Right 10-10-2014   EVLA right greater saphenous vein by TCurt JewsMD  . ENDOVENOUS ABLATION SAPHENOUS VEIN W/ LASER Left 12-05-2014   endovenous laser ablation left small saphenous vein by TCurt JewsMD  . INGUINAL HERNIA REPAIR Right   . TESTICLE REMOVAL Right     Family History  Problem Relation Age of Onset  . Deep vein thrombosis Mother   . Hypertension Mother   . Diabetes Father    Social History:  reports that  has never smoked. he has never used smokeless tobacco. He reports that he drinks alcohol. He reports that he does not use drugs.  Allergies: No Known Allergies  Medications  Prior to Admission  Medication Sig Dispense Refill  . BENAZEPRIL HCL PO Take 5 mg by mouth daily.     . furosemide (LASIX) 20 MG tablet Take 20 mg by mouth daily.    .Marland KitchenMETFORMIN HCL PO Take 500 mg by mouth daily.     . pravastatin (PRAVACHOL) 20 MG tablet Take 20 mg by mouth daily.     . Rivaroxaban (XARELTO PO) Take 20 mg by mouth daily.       Results for orders placed or performed during the hospital encounter of 09/02/17 (from the past 48 hour(s))  I-STAT, chem 8     Status: Abnormal   Collection Time: 09/02/17  6:22 AM  Result Value Ref Range   Sodium 141 135 - 145 mmol/L   Potassium 4.0 3.5 - 5.1 mmol/L   Chloride 100 (L) 101 - 111 mmol/L   BUN 21 (H) 6 - 20 mg/dL   Creatinine, Ser 1.30 (H) 0.61 - 1.24 mg/dL   Glucose, Bld 104 (H) 65 - 99 mg/dL   Calcium, Ion 1.23 1.15 - 1.40 mmol/L   TCO2 27 22 - 32 mmol/L   Hemoglobin 18.0 (H) 13.0 - 17.0 g/dL   HCT 53.0 (H) 39.0 - 52.0 %   No results found.  Review of Systems  Constitutional: Negative.  Negative for chills and fever.  HENT: Negative.   Eyes: Negative.  Respiratory: Negative.   Cardiovascular: Negative.  Negative for chest pain.  Gastrointestinal: Negative.   Genitourinary: Negative.   Musculoskeletal: Negative.   Skin: Negative.   Neurological: Negative.   Endo/Heme/Allergies: Negative.   Psychiatric/Behavioral: Negative.     Blood pressure (!) 163/83, pulse 84, temperature 98 F (36.7 C), temperature source Oral, resp. rate 16, height 5' 11" (1.803 m), weight 107 kg (236 lb), SpO2 100 %. Physical Exam  Constitutional: He appears well-developed.  HENT:  Head: Normocephalic.  Eyes: Pupils are equal, round, and reactive to light.  Neck: Normal range of motion.  Cardiovascular: Normal rate.  Respiratory: Effort normal.  GI: Soft.  Genitourinary:  Genitourinary Comments: Stable left peyronie's plaque  Musculoskeletal: Normal range of motion.  Neurological: He is alert.  Skin: Skin is warm.  Psychiatric:  He has a normal mood and affect.     Assessment/Plan  Proceed with penile plication. Risks, benefits, alternatives, expected peri-op course discussed previously and reiteraated today.   [name], MD 09/02/2017, 7:04 AM

## 2017-09-02 NOTE — Anesthesia Procedure Notes (Signed)
Procedure Name: Intubation Date/Time: 09/02/2017 7:54 AM Performed by: Justice Rocher, CRNA Pre-anesthesia Checklist: Patient identified, Emergency Drugs available, Suction available and Patient being monitored Patient Re-evaluated:Patient Re-evaluated prior to induction Oxygen Delivery Method: Circle system utilized Preoxygenation: Pre-oxygenation with 100% oxygen Induction Type: IV induction Ventilation: Mask ventilation without difficulty Laryngoscope Size: Mac and 4 Grade View: Grade III Tube type: Oral Tube size: 8.0 mm Number of attempts: 1 Airway Equipment and Method: Stylet and Oral airway Placement Confirmation: ETT inserted through vocal cords under direct vision,  positive ETCO2 and breath sounds checked- equal and bilateral Secured at: 23 cm Tube secured with: Tape Dental Injury: Teeth and Oropharynx as per pre-operative assessment  Comments: Intubated for better airway management

## 2017-09-02 NOTE — Transfer of Care (Signed)
Immediate Anesthesia Transfer of Care Note  Patient: Dalton Ochoa  Procedure(s) Performed: Procedure(s) (LRB): NESBIT PROCEDURE AND BLOCK (N/A)  Patient Location: PACU  Anesthesia Type: General  Level of Consciousness: awake, sedated, patient cooperative and responds to stimulation  Airway & Oxygen Therapy: Patient Spontanous Breathing and Patient connected to [place] O2 - face mask oxygen  Post-op Assessment: Report given to PACU RN, Post -op Vital signs reviewed and stable and Patient moving all extremities  Post vital signs: Reviewed and stable  Complications: No apparent anesthesia complications

## 2017-09-05 ENCOUNTER — Encounter (HOSPITAL_BASED_OUTPATIENT_CLINIC_OR_DEPARTMENT_OTHER): Payer: Self-pay | Admitting: Urology

## 2017-09-05 NOTE — Op Note (Signed)
NAME:  Dalton Ochoa, Dalton Ochoa NO.:  MEDICAL RECORD NO.:  [id number]  LOCATION:                                 FACILITY:  PHYSICIAN:  [name], MD          DATE OF BIRTH:  DATE OF PROCEDURE: 09/02/2017                              OPERATIVE REPORT   DIAGNOSIS:  Peyronie's disease.  PROCEDURES: 1. Penile plication. 2. Penile block.  ESTIMATED BLOOD LOSS:  20 cc.  COMPLICATION:  None.  SPECIMEN:  None.  FINDINGS: 1. Approximately 60-70 degrees of left and dorsal penile curvature     with maximum point of curvature distal one-third penis, pre     plication. 2. Resolution of approximately 90% of dorsal and left curvature     following penile plication.  INDICATION:  Dalton Ochoa is a 57 year old gentleman with history of erectile dysfunction.  He has noted a progressive curvature of the penis dorsally into the left that started bothering him around 2016 and 2017. This was consistent with Peyronie's disease.  His erections on oral medications were satisfactory and he is happy with the rigidity that was seen and, however, he is very bothered by the curvature.  He has documented stability of the curvature now in the stable phase of the disease for over a year, and he wishes to proceed with surgical correction with penile plication.  Informed consent was obtained and placed in the medical record.  PROCEDURE IN DETAIL:  The patient being Dalton Ochoa, was verified. Procedure being penile opacification was confirmed.  Procedure was carried out.  Time-out was performed.  Intravenous antibiotics were administered.  General LMA converted to endotracheal anesthesia was introduced.  The patient was placed into a supine position.  Sterile field was created by first clipper shaving, and prepping and draping the patient's penis, perineum and proximal thighs using iodine.  Next, a circumcision-type incision was made approximately 1 cm proximal to the corona of  the glans, and dissection was carried down to the corpora.  A Penrose drain 0.5-inch was placed at the base of the penis and artificial erection was performed using injectable saline, approximately 60 cc volume, this revealed approximately 60-70 degrees of curvature dorsally into the left point of maximal curvature distal third of the penis.  It was felt that multiple plication sutures likely in an asymmetric fashion would be necessary for this.  A point of maximal curvature was marked and two sets per side plication sutures were placed with 2-0 Ethibond after Foley catheter was placed to mark urethra and ensured no urethral injury.  These were then tied to cosmesis.  This resolved approximately 75% of his dorsal and left curvature with these set up four sutures.  However, he still did have some that was curvature to the left slightly dorsally.  As such, an additional fifth plication suture was placed lateral on the right side to cosmesis, which resulted in at least 90% reduction of curvature.  I was very happy with the results of this.  Repeat artificial erection confirmed.  The proximal tourniquet was taken down and the edema squeezed from the penis. Additional hemostasis was achieved with point coagulation  current cautery.  Penile skin was reapproximated using two separate suture lines of running Vicryl.  Attention was directed at penile block, 10 cc of Marcaine was instilled in a ring block-type fashion at the base of the penis.  Next, the dressing of Kling and Coban was applied, snugged, but not tight.  Foley catheter was removed and procedure was terminated. The patient tolerated the procedure well.  There were no immediate periprocedural complications.  The patient was taken to the postanesthesia care unit in stable condition.          ______________________________ [name], MD     TM/MEDQ  D:  09/02/2017  T:  09/02/2017  Job:  [id number]

## 2017-12-05 ENCOUNTER — Ambulatory Visit: Payer: Managed Care, Other (non HMO) | Admitting: Neurology

## 2017-12-05 ENCOUNTER — Encounter: Payer: Self-pay | Admitting: Neurology

## 2017-12-05 ENCOUNTER — Other Ambulatory Visit: Payer: Self-pay

## 2017-12-05 VITALS — BP 127/80 | HR 55 | Ht 71.0 in | Wt 238.5 lb

## 2017-12-05 DIAGNOSIS — R251 Tremor, unspecified: Secondary | ICD-10-CM

## 2017-12-05 MED ORDER — PRIMIDONE 50 MG PO TABS: 50.0000 mg | ORAL_TABLET | Freq: Every day | ORAL | 4 refills | Status: DC

## 2017-12-05 NOTE — Patient Instructions (Signed)
   We will start Mysoline for the tremor.

## 2017-12-05 NOTE — Progress Notes (Signed)
Reason for visit: Tremor  Dalton Ochoa is an 57 y.o. male  History of present illness:  Dalton Ochoa is a 57 year old right-handed white male with a history of a tremor affecting mainly in the right hand.  The patient indicates that when he gets upset or nervous about something the tremor will significantly worsen.  He notes some positional quality to the tremor, when he pronates the arm when he is holding something, the tremor will become quite prominent.  The patient does have some difficulty getting a cup to his mouth or with using a screwdriver.  The patient does not have much tremor with the left hand.  He denies any resting tremor.  Indicates that his mother also had a tremor that mainly affected the mouth.  The patient denies any vocal tremor.  He comes into the office today as he wants a medication that he can take daily to help suppress the tremor.  Previously, MRI of the brain showed minimal white matter changes.  Past Medical History:  Diagnosis Date  . Diabetes mellitus without complication ([place])   . DVT (deep venous thrombosis) ([place])   . Gout   . Hypertension   . Inguinal hernia   . Occasional tremors    Right hand  . Pulmonary emboli ([place]) 07/21/2005  . Varicose veins   . Venous stasis     Past Surgical History:  Procedure Laterality Date  . ENDOVENOUS ABLATION SAPHENOUS VEIN W/ LASER Right 10-10-2014   EVLA right greater saphenous vein by Curt Jews MD  . ENDOVENOUS ABLATION SAPHENOUS VEIN W/ LASER Left 12-05-2014   endovenous laser ablation left small saphenous vein by Curt Jews MD  . INGUINAL HERNIA REPAIR Right   . NESBIT PROCEDURE N/A 09/02/2017   Procedure: NESBIT PROCEDURE AND BLOCK;  Surgeon: [name], MD;  Location: [facility];  Service: Urology;  Laterality: N/A;  . TESTICLE REMOVAL Right     Family History  Problem Relation Age of Onset  . Deep vein thrombosis Mother   . Hypertension Mother   . Diabetes Father      Social history:  reports that he has never smoked. He has never used smokeless tobacco. He reports that he drank alcohol. He reports that he does not use drugs.   No Known Allergies  Medications:  Prior to Admission medications   Medication Sig Start Date End Date Taking? Authorizing Provider  BENAZEPRIL HCL PO Take 5 mg by mouth daily.    Yes [provider]  furosemide (LASIX) 20 MG tablet Take 20 mg by mouth daily.   Yes [provider]  METFORMIN HCL PO Take 500 mg by mouth daily.    Yes [provider]  pravastatin (PRAVACHOL) 20 MG tablet Take 20 mg by mouth daily.    Yes [provider]  senna-docusate (SENOKOT-S) 8.6-50 MG tablet Take 1 tablet by mouth 2 (two) times daily. 09/02/17  Yes [name], MD    ROS:  Out of a complete 14 system review of symptoms, the patient complains only of the following symptoms, and all other reviewed systems are negative.  Tremor  Blood pressure 127/80, pulse (!) 55, height 5\' 11"  (1.803 m), weight 238 lb 8 oz (108.2 kg).  Physical Exam  General: The patient is alert and cooperative at the time of the examination.  Skin: 2+ edema below the knees is noted bilaterally.   Neurologic Exam  Mental status: The patient is alert and oriented x 3  at the time of the examination. The patient has apparent normal recent and remote memory, with an apparently normal attention span and concentration ability.   Cranial nerves: Facial symmetry is present. Speech is normal, no aphasia or dysarthria is noted. Extraocular movements are full. Visual fields are full.  Motor: The patient has good strength in all 4 extremities.  Sensory examination: Soft touch sensation is symmetric on the face, arms, and legs.  Coordination: The patient has good finger-nose-finger and heel-to-shin bilaterally.  No significant tremor was seen with finger-nose-finger bilaterally.  With drawing a spiral, no significant tremor was  translated into handwriting.  Gait and station: The patient has a normal gait. Tandem gait is normal. Romberg is negative. No drift is seen.  Reflexes: Deep tendon reflexes are symmetric.   MRI brain 10/28/16:  IMPRESSION:  Mildly abnormal MRI brain (with and without) demonstrating: 1. Mild scattered periventricular and subcortical and juxtacortical foci of non-specific gliosis.  2. No abnormal lesions are seen on post contrast views.  3. No acute findings.  Assessment/Plan:  1.  Mild essential tremor, right upper extremity  The patient will be placed on low-dose Mysoline taking 50 mg at night.  A prescription was sent in.  He will follow-up in 6 months, he will call for any dose adjustments.  Jill Alexanders MD 12/05/2017 8:46 AM  [facility] [address] [place], [place] [address]  Phone [phone] Fax [phone]

## 2018-05-23 ENCOUNTER — Other Ambulatory Visit: Payer: Self-pay | Admitting: Neurology

## 2018-06-15 ENCOUNTER — Ambulatory Visit: Payer: Managed Care, Other (non HMO) | Admitting: Nurse Practitioner

## 2018-06-27 ENCOUNTER — Other Ambulatory Visit: Payer: Self-pay | Admitting: Neurology

## 2018-09-06 NOTE — Progress Notes (Signed)
[facility]  PATIENT: Dalton Ochoa DOB: Jul 18, 1961   REASON FOR VISIT: Follow-up for essential tremor HISTORY FROM: Patient    HISTORY OF PRESENT ILLNESS:UPDATE 2/13/2020CM Mr. Taglieri, 58 year old male returns for follow-up with a history of tremor mainly affecting his right hand.  The tremor gets significantly worse if he is upset.  The patient says he has problems with writing.  He has minimal problems with eating.  His tremor is much improved with Mysoline.  He denies any side effects to the drug.  He has no resting tremor.  He denies any vocal tremor.  MRI of the brain has showed minimal white matter changes in the past.  He returns for reevaluation 5/13/19KWMr. Stmarie is a 58 year old right-handed white male with a history of a tremor affecting mainly in the right hand.  The patient indicates that when he gets upset or nervous about something the tremor will significantly worsen.  He notes some positional quality to the tremor, when he pronates the arm when he is holding something, the tremor will become quite prominent.  The patient does have some difficulty getting a cup to his mouth or with using a screwdriver.  The patient does not have much tremor with the left hand.  He denies any resting tremor.  Indicates that his mother also had a tremor that mainly affected the mouth.  The patient denies any vocal tremor.  He comes into the office today as he wants a medication that he can take daily to help suppress the tremor.  Previously, MRI of the brain showed minimal white matter changes.     REVIEW OF SYSTEMS: Full 14 system review of systems performed and notable only for those listed, all others are neg:  Constitutional: neg  Cardiovascular: neg Ear/Nose/Throat: neg  Skin: neg Eyes: neg Respiratory: neg Gastroitestinal: neg  Hematology/Lymphatic: neg  Endocrine: neg Musculoskeletal:neg Allergy/Immunology: neg Neurological: Tremor right  hand Psychiatric: neg Sleep : neg   ALLERGIES: No Known Allergies  HOME MEDICATIONS: Outpatient Medications Prior to Visit  Medication Sig Dispense Refill  . BENAZEPRIL HCL PO Take 5 mg by mouth daily.     . furosemide (LASIX) 20 MG tablet Take 20 mg by mouth daily.    [name] METFORMIN HCL PO Take 500 mg by mouth daily.     . pravastatin (PRAVACHOL) 20 MG tablet Take 20 mg by mouth daily.     . primidone (MYSOLINE) 50 MG tablet Take 1 tablet (50 mg total) by mouth at bedtime. Must be seen prior to future refills. Please keep pending 09/07/18 appt. 30 tablet 2  . senna-docusate (SENOKOT-S) 8.6-50 MG tablet Take 1 tablet by mouth 2 (two) times daily. 30 tablet 0   No facility-administered medications prior to visit.     PAST MEDICAL HISTORY: Past Medical History:  Diagnosis Date  . Diabetes mellitus without complication ([place])   . DVT (deep venous thrombosis) ([place])   . Gout   . Hypertension   . Inguinal hernia   . Occasional tremors    Right hand  . Pulmonary emboli ([place]) 07/21/2005  . Varicose veins   . Venous stasis     PAST SURGICAL HISTORY: Past Surgical History:  Procedure Laterality Date  . ENDOVENOUS ABLATION SAPHENOUS VEIN W/ LASER Right 10-10-2014   EVLA right greater saphenous vein by Curt Jews MD  . ENDOVENOUS ABLATION SAPHENOUS VEIN W/ LASER Left 12-05-2014   endovenous laser ablation left small saphenous vein by Curt Jews MD  . INGUINAL HERNIA  REPAIR Right   . NESBIT PROCEDURE N/A 09/02/2017   Procedure: NESBIT PROCEDURE AND BLOCK;  Surgeon: [name], MD;  Location: [facility];  Service: Urology;  Laterality: N/A;  . TESTICLE REMOVAL Right     FAMILY HISTORY: Family History  Problem Relation Age of Onset  . Deep vein thrombosis Mother   . Hypertension Mother   . Diabetes Father     SOCIAL HISTORY: Social History   Socioeconomic History  . Marital status: Married    Spouse name: Lilyan Punt  . Number of children: 2  . Years of  education: 70  . Highest education level: Not on file  Occupational History  . 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  Social Needs  . Financial resource strain: Not on file  . Food insecurity:    Worry: Not on file    Inability: Not on file  . Transportation needs:    Medical: Not on file    Non-medical: Not on file  Tobacco Use  . Smoking status: Never Smoker  . Smokeless tobacco: Never Used  Substance and Sexual Activity  . Alcohol use: Not Currently    Alcohol/week: 0.0 standard drinks    Comment: occ  . Drug use: No  . Sexual activity: Not on file  Lifestyle  . Physical activity:    Days per week: Not on file    Minutes per session: Not on file  . Stress: Not on file  Relationships  . Social connections:    Talks on phone: Not on file    Gets together: Not on file    Attends religious service: Not on file    Active member of club or organization: Not on file    Attends meetings of clubs or organizations: Not on file    Relationship status: Not on file  . Intimate partner violence:    Fear of current or ex partner: Not on file    Emotionally abused: Not on file    Physically abused: Not on file    Forced sexual activity: Not on file  Other Topics Concern  . Not on file  Social History Narrative   Lives with wife and two daughters   Caffeine use: 2 sodas per month   Right handed      PHYSICAL EXAM  Vitals:   09/07/18 1012  BP: 126/71  Pulse: 68  Weight: 234 lb 3.2 oz (106.2 kg)  Height: 5\' 11"  (1.803 m)   Body mass index is 32.66 kg/m.  Generalized: Well developed, obese male in no acute distress  Head: normocephalic and atraumatic,. Oropharynx benign  Neck: Supple,  Musculoskeletal: No deformity   Neurological examination   Mentation: Alert oriented to time, place, history taking. Attention span and concentration appropriate. Recent and remote memory intact.  Follows all commands speech and language fluent.   Cranial nerve II-XII: Pupils were equal round  reactive to light extraocular movements were full, visual field were full on confrontational test. Facial sensation and strength were normal. hearing was intact to finger rubbing bilaterally. Uvula tongue midline. head turning and shoulder shrug were normal and symmetric.Tongue protrusion into cheek strength was normal. Motor: normal bulk and tone, full strength in the BUE, BLE,  Sensory: normal and symmetric to light touch,  Coordination: finger-nose-finger, heel-to-shin bilaterally, no dysmetria.  No significant tremor seen with finger-to-nose bilaterally.  Handwriting sample without significant tremor noted. Reflexes: Brachioradialis 2/2, biceps 2/2, triceps 2/2, patellar 2/2, Achilles 2/2, plantar responses were flexor bilaterally. Gait and Station: Rising up from  seated position without assistance, normal stance,  moderate stride, good arm swing, smooth turning, able to perform tiptoe, and heel walking without difficulty. Tandem gait is steady  DIAGNOSTIC DATA (LABS, IMAGING, TESTING) - I reviewed patient records, labs, notes, testing and imaging myself where available.  Lab Results  Component Value Date   WBC 3.6 (L) 04/29/2008   HGB 18.0 (H) 09/02/2017   HCT 53.0 (H) 09/02/2017   MCV 92.6 04/29/2008   PLT 206 04/29/2008      Component Value Date/Time   NA 141 09/02/2017 0622   NA 140 09/09/2016 1003   K 4.0 09/02/2017 0622   CL 100 (L) 09/02/2017 0622   CO2 20 09/09/2016 1003   GLUCOSE 104 (H) 09/02/2017 0622   BUN 21 (H) 09/02/2017 0622   BUN 21 09/09/2016 1003   CREATININE 1.30 (H) 09/02/2017 0622   CALCIUM 9.5 09/09/2016 1003   PROT 7.8 09/09/2016 1003   ALBUMIN 4.6 09/09/2016 1003   AST 29 09/09/2016 1003   ALT 23 09/09/2016 1003   ALKPHOS 84 09/09/2016 1003   BILITOT 0.7 09/09/2016 1003   GFRNONAA 59 (L) 09/09/2016 1003   GFRAA 68 09/09/2016 1003    Lab Results  Component Value Date   TSH 1.010 09/09/2016      ASSESSMENT AND PLAN  58 y.o. year old male   has a past medical history of Diabetes mellitus without complication ([place]), DVT (deep venous thrombosis) ([place]), Gout, Hypertension, Inguinal hernia, Occasional tremors, Pulmonary emboli ([place]) (07/21/2005), Varicose veins, and Venous stasis. here to follow-up for mild essential tremor right upper extremity .    PLAN: Continue Mysoline 50 mg daily will refill  Try oversized pen or pencil Can use weighted utensils for eating available at [facility] Follow-up yearly and prn Call for worsening of tremor Dennie Bible, [facility], [facility], [facility] [address] [place], [place] [address] [phone]

## 2018-09-07 ENCOUNTER — Ambulatory Visit: Payer: Managed Care, Other (non HMO) | Admitting: Nurse Practitioner

## 2018-09-07 ENCOUNTER — Encounter: Payer: Self-pay | Admitting: Nurse Practitioner

## 2018-09-07 VITALS — BP 126/71 | HR 68 | Ht 71.0 in | Wt 234.2 lb

## 2018-09-07 DIAGNOSIS — R251 Tremor, unspecified: Secondary | ICD-10-CM

## 2018-09-07 MED ORDER — PRIMIDONE 50 MG PO TABS: 50.0000 mg | ORAL_TABLET | Freq: Every day | ORAL | 3 refills | Status: DC

## 2018-09-07 NOTE — Progress Notes (Signed)
I have read the note, and I agree with the clinical assessment and plan.  Dalton Ochoa   

## 2018-09-07 NOTE — Patient Instructions (Signed)
Continue Mysoline 50 mg daily Try oversized pen or pencil Can use weighted utensils for eating Follow-up yearly and prn

## 2018-10-03 ENCOUNTER — Other Ambulatory Visit: Payer: Self-pay | Admitting: Neurology

## 2018-11-15 ENCOUNTER — Emergency Department (HOSPITAL_COMMUNITY)
Admission: EM | Admit: 2018-11-15 | Discharge: 2018-11-15 | Disposition: A | Discharge status: Home / Self Care | Payer: Managed Care, Other (non HMO) | Attending: Emergency Medicine | Admitting: Emergency Medicine

## 2018-11-15 ENCOUNTER — Emergency Department (HOSPITAL_COMMUNITY): Payer: Managed Care, Other (non HMO)

## 2018-11-15 ENCOUNTER — Other Ambulatory Visit: Payer: Self-pay

## 2018-11-15 DIAGNOSIS — E119 Type 2 diabetes mellitus without complications: Secondary | ICD-10-CM | POA: Insufficient documentation

## 2018-11-15 DIAGNOSIS — Z7901 Long term (current) use of anticoagulants: Secondary | ICD-10-CM | POA: Insufficient documentation

## 2018-11-15 DIAGNOSIS — I10 Essential (primary) hypertension: Secondary | ICD-10-CM | POA: Insufficient documentation

## 2018-11-15 DIAGNOSIS — I471 Supraventricular tachycardia: Secondary | ICD-10-CM | POA: Insufficient documentation

## 2018-11-15 DIAGNOSIS — R9431 Abnormal electrocardiogram [ECG] [EKG]: Secondary | ICD-10-CM | POA: Diagnosis present

## 2018-11-15 DIAGNOSIS — Z79899 Other long term (current) drug therapy: Secondary | ICD-10-CM | POA: Insufficient documentation

## 2018-11-15 DIAGNOSIS — Z7984 Long term (current) use of oral hypoglycemic drugs: Secondary | ICD-10-CM | POA: Insufficient documentation

## 2018-11-15 LAB — URINALYSIS, ROUTINE W REFLEX MICROSCOPIC
Bacteria, UA: NONE SEEN
Bilirubin Urine: NEGATIVE
Glucose, UA: NEGATIVE mg/dL
Hgb urine dipstick: NEGATIVE
Ketones, ur: NEGATIVE mg/dL
Leukocytes,Ua: NEGATIVE
Nitrite: NEGATIVE
Protein, ur: 100 mg/dL — AB
Specific Gravity, Urine: 1.016 (ref 1.005–1.030)
pH: 5 (ref 5.0–8.0)

## 2018-11-15 LAB — RAPID URINE DRUG SCREEN, HOSP PERFORMED
Amphetamines: NOT DETECTED
Barbiturates: POSITIVE — AB
Benzodiazepines: NOT DETECTED
Cocaine: NOT DETECTED
Opiates: NOT DETECTED
Tetrahydrocannabinol: NOT DETECTED

## 2018-11-15 LAB — BASIC METABOLIC PANEL
Anion gap: 16 — ABNORMAL HIGH (ref 5–15)
BUN: 25 mg/dL — ABNORMAL HIGH (ref 6–20)
CO2: 21 mmol/L — ABNORMAL LOW (ref 22–32)
Calcium: 9.2 mg/dL (ref 8.9–10.3)
Chloride: 99 mmol/L (ref 98–111)
Creatinine, Ser: 1.6 mg/dL — ABNORMAL HIGH (ref 0.61–1.24)
GFR calc Af Amer: 55 mL/min — ABNORMAL LOW (ref >60)
GFR calc non Af Amer: 47 mL/min — ABNORMAL LOW (ref >60)
Glucose, Bld: 121 mg/dL — ABNORMAL HIGH (ref 70–99)
Potassium: 4.2 mmol/L (ref 3.5–5.1)
Sodium: 136 mmol/L (ref 135–145)

## 2018-11-15 LAB — CBG MONITORING, ED: Glucose-Capillary: 114 mg/dL — ABNORMAL HIGH (ref 70–99)

## 2018-11-15 LAB — CBC
HCT: 52.7 % — ABNORMAL HIGH (ref 39.0–52.0)
Hemoglobin: 17.8 g/dL — ABNORMAL HIGH (ref 13.0–17.0)
MCH: 33 pg (ref 26.0–34.0)
MCHC: 33.8 g/dL (ref 30.0–36.0)
MCV: 97.6 fL (ref 80.0–100.0)
Platelets: 232 10*3/uL (ref 150–400)
RBC: 5.4 MIL/uL (ref 4.22–5.81)
RDW: 11.6 % (ref 11.5–15.5)
WBC: 5.3 10*3/uL (ref 4.0–10.5)
nRBC: 0 % (ref 0.0–0.2)

## 2018-11-15 LAB — TROPONIN I
Troponin I: 0.04 ng/mL (ref <0.03)
Troponin I: 0.08 ng/mL (ref <0.03)

## 2018-11-15 LAB — TSH: TSH: 1.413 u[IU]/mL (ref 0.350–4.500)

## 2018-11-15 LAB — D-DIMER, QUANTITATIVE: D-Dimer, Quant: <0.27 ug/mL-FEU (ref 0.00–0.50)

## 2018-11-15 MED ORDER — METOPROLOL SUCCINATE ER 25 MG PO TB24: 25.0000 mg | ORAL_TABLET | Freq: Every day | ORAL | 0 refills | Status: DC

## 2018-11-15 MED ORDER — METOPROLOL TARTRATE 5 MG/5ML IV SOLN
5.0000 mg | Freq: Once | INTRAVENOUS | Status: AC
  Administered 2018-11-15: 5 mg via INTRAVENOUS
  Filled 2018-11-15: qty 5

## 2018-11-15 MED ORDER — SODIUM CHLORIDE 0.9 % IV BOLUS
1000.0000 mL | Freq: Once | INTRAVENOUS | Status: AC
  Administered 2018-11-15: 14:00:00 1000 mL via INTRAVENOUS

## 2018-11-15 MED ORDER — ADENOSINE 6 MG/2ML IV SOLN
6.0000 mg | Freq: Once | INTRAVENOUS | Status: AC
  Administered 2018-11-15: 6 mg via INTRAVENOUS
  Filled 2018-11-15: qty 2

## 2018-11-15 MED ORDER — ADENOSINE 6 MG/2ML IV SOLN
12.0000 mg | Freq: Once | INTRAVENOUS | Status: AC
  Administered 2018-11-15: 12 mg via INTRAVENOUS

## 2018-11-15 NOTE — ED Notes (Signed)
Pt converted into NSR after 12mg  adenosine. Pt reports feeling much better.

## 2018-11-15 NOTE — ED Provider Notes (Signed)
[facility] Provider Note   CSN: [id number] Arrival date & time: 11/15/18  1157    History   Chief Complaint Chief Complaint  Patient presents with  . Abnormal ECG    HPI LEVEON Ochoa is a 58 y.o. male.     Pt presents to the ED today with an abnormal EKG.  He woke up from sleep around 0400 with cp and feeling like his heart was racing.  He had a little sob, but not much.  He said he was sweaty when he woke up.  He went to his pcp's office who performed an EKG which revealed a wide complex tachycardia.  He was sent here.  He does have a hx of PE and has been compliant with his Xarelto.  He has not noticed any swelling or pain to his legs.  He does not have a hx of CAD.     Past Medical History:  Diagnosis Date  . Diabetes mellitus without complication ([facility])   . DVT (deep venous thrombosis) ([facility]cho[facility])   . Gout   . Hypertension   . Inguinal hernia   . Occasional tremors    Right hand  . Pulmonary emboli ([facility]) 07/21/2005  . Varicose veins   . Venous stasis     Patient Active Problem List   Diagnosis Date Noted  . Tremor of right hand 09/09/2016  . Varicose veins of lower extremities with ulcer ([facility] Ochoa) 10/10/2014    Past Surgical History:  Procedure Laterality Date  . ENDOVENOUS ABLATION SAPHENOUS VEIN W/ LASER Right 10-10-2014   EVLA right greater saphenous vein by Curt Jews MD  . ENDOVENOUS ABLATION SAPHENOUS VEIN W/ LASER Left 12-05-2014   endovenous laser ablation left small saphenous vein by Curt Jews MD  . INGUINAL HERNIA REPAIR Right   . NESBIT PROCEDURE N/A 09/02/2017   Procedure: NESBIT PROCEDURE AND BLOCK;  Surgeon: [name], MD;  Location: [facility];  Service: Urology;  Laterality: N/A;  . TESTICLE REMOVAL Right         Home Medications    Prior to Admission medications   Medication Sig Start Date End Date Taking? Authorizing Provider  BENAZEPRIL HCL PO Take 5 mg by mouth daily.    Yes  [provider]  furosemide (LASIX) 20 MG tablet Take 20 mg by mouth daily.   Yes [provider]  METFORMIN HCL PO Take 500 mg by mouth daily.    Yes [provider]  pravastatin (PRAVACHOL) 20 MG tablet Take 20 mg by mouth daily.    Yes [provider]  primidone (MYSOLINE) 50 MG tablet Take 1 tablet (50 mg total) by mouth at bedtime. Must be seen prior to future refills. Please keep pending 09/07/18 appt. Patient taking differently: Take 50 mg by mouth daily.  09/07/18  Yes Dennie Bible, NP  XARELTO 20 MG TABS tablet Take 20 mg by mouth daily. 10/23/18  Yes [provider]  XYOSTED 75 MG/0.5ML SOAJ Inject 75 mg into the skin every Tuesday. 10/05/18  Yes [provider]  senna-docusate (SENOKOT-S) 8.6-50 MG tablet Take 1 tablet by mouth 2 (two) times daily. Patient not taking: Reported on 11/15/2018 09/02/17   [name], MD    Family History Family History  Problem Relation Age of Onset  . Deep vein thrombosis Mother   . Hypertension Mother   . Diabetes Father     Social History Social History   Tobacco Use  . Smoking  status: Never Smoker  . Smokeless tobacco: Never Used  Substance Use Topics  . Alcohol use: Not Currently    Alcohol/week: 0.0 standard drinks    Comment: occ  . Drug use: No     Allergies   Patient has no known allergies.   Review of Systems Review of Systems  Cardiovascular: Positive for chest pain and palpitations.  All other systems reviewed and are negative.    Physical Exam Updated Vital Signs BP 128/78   Pulse (!) 119   Resp (!) 21   Ht 5\' 10"  (1.778 m)   Wt 103.4 kg   SpO2 97%   BMI 32.71 kg/m   Physical Exam Vitals signs and nursing note reviewed.  Constitutional:      Appearance: Normal appearance.  HENT:     Head: Normocephalic and atraumatic.     Right Ear: External ear normal.     Nose: Nose normal.     Mouth/Throat:     Mouth: Mucous membranes are moist.  Eyes:      Extraocular Movements: Extraocular movements intact.     Pupils: Pupils are equal, round, and reactive to light.  Neck:     Musculoskeletal: Normal range of motion and neck supple.  Cardiovascular:     Rate and Rhythm: Regular rhythm. Tachycardia present.     Pulses: Normal pulses.     Heart sounds: Normal heart sounds.  Pulmonary:     Effort: Pulmonary effort is normal.     Breath sounds: Normal breath sounds.  Abdominal:     General: Abdomen is flat.     Palpations: Abdomen is soft.  Musculoskeletal: Normal range of motion.  Skin:    General: Skin is warm.     Capillary Refill: Capillary refill takes less than 2 seconds.  Neurological:     General: No focal deficit present.     Mental Status: He is alert and oriented to person, place, and time.  Psychiatric:        Behavior: Behavior normal.      ED Treatments / Results  Labs (all labs ordered are listed, but only abnormal results are displayed) Labs Reviewed  BASIC METABOLIC PANEL - Abnormal; Notable for the following components:      Result Value   CO2 21 (*)    Glucose, Bld 121 (*)    BUN 25 (*)    Creatinine, Ser 1.60 (*)    GFR calc non Af Amer 47 (*)    GFR calc Af Amer 55 (*)    Anion gap 16 (*)    All other components within normal limits  CBC - Abnormal; Notable for the following components:   Hemoglobin 17.8 (*)    HCT 52.7 (*)    All other components within normal limits  TROPONIN I - Abnormal; Notable for the following components:   Troponin I 0.04 (*)    All other components within normal limits  CBG MONITORING, ED - Abnormal; Notable for the following components:   Glucose-Capillary 114 (*)    All other components within normal limits  TSH  D-DIMER, QUANTITATIVE (NOT AT [facility])  URINALYSIS, ROUTINE W REFLEX MICROSCOPIC  RAPID URINE DRUG SCREEN, HOSP PERFORMED  TROPONIN I    EKG EKG Interpretation  Date/Time:  Wednesday November 15 2018 15:47:11 EDT Ventricular Rate:  76 PR Interval:     QRS Duration: 150 QT Interval:  425 QTC Calculation: 478 R Axis:   -125 Text Interpretation:  Sinus rhythm Prolonged PR interval LAE,  consider biatrial enlargement Right bundle branch block Probable anteroseptal infarct, old after 18 mg total adenosine Confirmed by Isla Pence [phone]) on 11/15/2018 3:53:03 PM   Radiology Dg Chest Port 1 View  Result Date: 11/15/2018 CLINICAL DATA:  Abnormal EKG.  Chest pain. EXAM: PORTABLE CHEST 1 VIEW COMPARISON:  July 23, 2005 FINDINGS: The heart size and mediastinal contours are within normal limits. Both lungs are clear. The visualized skeletal structures are unremarkable. IMPRESSION: No active disease. Electronically Signed   By: Dorise Bullion III M.D   On: 11/15/2018 12:59    Procedures .Cardioversion Date/Time: 11/15/2018 3:53 PM Performed by: Isla Pence, MD Authorized by: Isla Pence, MD   Consent:    Consent obtained:  Verbal   Consent given by:  Patient   Risks discussed:  Cutaneous burn, pain and induced arrhythmia   Alternatives discussed:  No treatment Pre-procedure details:    Cardioversion basis:  Emergent   Rhythm:  Supraventricular tachycardia   Electrode placement:  Anterior-posterior Patient sedated: No Post-procedure details:    Patient status:  Awake   Patient tolerance of procedure:  Tolerated well, no immediate complications Comments:     Pt cardioverted chemically.  He was first given 6 mg of adenosine which did not help.  He was then given 12 mg adenosine which did convert him to NSR.     (including critical care time)  Medications Ordered in ED Medications  metoprolol tartrate (LOPRESSOR) injection 5 mg (5 mg Intravenous Given 11/15/18 1230)  metoprolol tartrate (LOPRESSOR) injection 5 mg (5 mg Intravenous Given 11/15/18 1401)  sodium chloride 0.9 % bolus 1,000 mL (0 mLs Intravenous Stopped 11/15/18 1552)  adenosine (ADENOCARD) 6 MG/2ML injection 6 mg (6 mg Intravenous Given 11/15/18 1545)   adenosine (ADENOCARD) 6 MG/2ML injection 12 mg (12 mg Intravenous Given 11/15/18 1547)     Initial Impression / Assessment and Plan / ED Course  I have reviewed the triage vital signs and the nursing notes.  Pertinent labs & imaging results that were available during my care of the patient were reviewed by me and considered in my medical decision making (see chart for details).     Pt's HR decreased with lopressor, but it is still 120.  I spoke with Dr. [name] (cards) who recommended adenosine.  The pt was given 6, then 12 mg adenosine which did convert him.  He is feeling much better.  Pt d/w Dr. [name] again who recommended Toprol XL 25 mg daily.  I told pt to take that instead of the Benazepril for now to see how he tolerates the Toprol.    Pt instructed to f/u with cards.  Return if worse.  Final Clinical Impressions(s) / ED Diagnoses   Final diagnoses:  SVT (supraventricular tachycardia) [facility])    ED Discharge Orders    None       Isla Pence, MD 11/16/18 [phone]

## 2018-11-15 NOTE — Discharge Instructions (Signed)
Stop Benazepril while on Toprol.

## 2018-11-15 NOTE — ED Triage Notes (Signed)
Pt sent from doctor's office for abnormal ekg. Pt endorses mild chest central pain that woke him up out of sleep this morning with diaphoresis.

## 2018-11-16 NOTE — Progress Notes (Signed)
Virtual Visit via Video Note changed to phone visit at pt request due to technical isssues   This visit type was conducted due to national recommendations for restrictions regarding the COVID-19 Pandemic (e.g. social distancing) in an effort to limit this patient's exposure and mitigate transmission in our community.  Due to his co-morbid illnesses, this patient is at least at moderate risk for complications without adequate follow up.  This format is felt to be most appropriate for this patient at this time.  All issues noted in this document were discussed and addressed.  A limited physical exam was performed with this format.  Please refer to the patient's chart for his consent to telehealth for [facility].   Evaluation Performed:  Follow-up visit  Date:  11/23/2018   ID:  Dalton Ochoa, DOB 03/04/1961, MRN [id number]  Patient Location: Home Provider Location: Home  PCP:  [name], MD  Cardiologist:  Dr [name]  Chief Complaint:  SVT  History of Present Illness:    Dalton Ochoa is a 58 y.o. male for evaluation of supraventricular tachycardia at request of Virgel Manifold, MD.  Echocardiogram 2006 showed normal LV function and mild biatrial enlargement.  Small pericardial effusion.  Patient presented April 22 with complaints of palpitations, weakness and chest heaviness.  Electrocardiogram showed supraventricular tachycardia, right bundle branch block and prior septal infarct cannot be excluded.  Patient was given metoprolol with some slowing of the heart rate.  He was ultimately given adenosine and converted to sinus rhythm.  Troponin 0 0.04 and 0.08. Cr 1.6. TSH 1.43. Cardiology now asked to evaluate.  Patient typically does not have dyspnea on exertion, orthopnea, PND, chest pain or syncope.  On the morning of his SVT he developed sudden onset of palpitations.  He then noted some dyspnea with climbing stairs which was unusual and mild chest tightness.  His symptoms  completely resolved with adenosine.  The patient does not have symptoms concerning for COVID-19 infection (fever, chills, cough, or new shortness of breath).    Past Medical History:  Diagnosis Date  . Diabetes mellitus without complication ([place])   . DVT (deep venous thrombosis) ([facility])   . Gout   . Hyperlipidemia   . Hypertension   . Inguinal hernia   . Occasional tremors    Right hand  . Pulmonary emboli ([facility]) 07/21/2005  . Varicose veins   . Venous stasis    Past Surgical History:  Procedure Laterality Date  . ENDOVENOUS ABLATION SAPHENOUS VEIN W/ LASER Right 10-10-2014   EVLA right greater saphenous vein by Curt Jews MD  . ENDOVENOUS ABLATION SAPHENOUS VEIN W/ LASER Left 12-05-2014   endovenous laser ablation left small saphenous vein by Curt Jews MD  . INGUINAL HERNIA REPAIR Right   . NESBIT PROCEDURE N/A 09/02/2017   Procedure: NESBIT PROCEDURE AND BLOCK;  Surgeon: [name], MD;  [facility]  Service: Urology;  Laterality: N/A;  . TESTICLE REMOVAL Right      Current Meds  Medication Sig  . furosemide (LASIX) 20 MG tablet Take 20 mg by mouth daily.  [name] METFORMIN HCL PO Take 500 mg by mouth daily.   . metoprolol succinate (TOPROL-XL) 25 MG 24 hr tablet Take 1 tablet (25 mg total) by mouth daily.  . pravastatin (PRAVACHOL) 20 MG tablet Take 20 mg by mouth daily.   . primidone (MYSOLINE) 50 MG tablet Take 1 tablet (50 mg total) by mouth at bedtime. Must be seen prior to future  refills. Please keep pending 09/07/18 appt. (Patient taking differently: Take 50 mg by mouth daily. )  . XARELTO 20 MG TABS tablet Take 20 mg by mouth daily.     Allergies:   Patient has no known allergies.   Social History   Tobacco Use  . Smoking status: Never Smoker  . Smokeless tobacco: Never Used  Substance Use Topics  . Alcohol use: Yes    Alcohol/week: 0.0 standard drinks    Comment: occ  . Drug use: No     Family Hx: The patient's family history  includes Deep vein thrombosis in his mother; Diabetes in his father; Dilated cardiomyopathy in his brother; Hypertension in his mother.  ROS:   Please see the history of present illness.    Patient denies fevers, chills or productive cough. All other systems reviewed and are negative.   Labs/Other Tests and Data Reviewed:    EKG:  An ECG dated 11/15/18 was personally reviewed today and demonstrated:  Supraventricular tachycardia, right bundle branch block.  Follow-up electrocardiogram showed sinus rhythm, first-degree AV block, right bundle branch block, prior septal infarct cannot be excluded.  Recent Labs: 11/15/2018: BUN 25; Creatinine, Ser 1.60; Hemoglobin 17.8; Platelets 232; Potassium 4.2; Sodium 136; TSH 1.413   Wt Readings from Last 3 Encounters:  11/23/18 232 lb (105.2 kg)  11/15/18 228 lb (103.4 kg)  09/07/18 234 lb 3.2 oz (106.2 kg)     Objective:    Vital Signs:  Ht 5\' 10"  (1.778 m)   Wt 232 lb (105.2 kg)   BMI 33.29 kg/m    VITAL SIGNS:  reviewed  No acute distress Normal affect Answers questions appropriately Remainder of physical examination not performed (telehealth visit; coronavirus pandemic)  ASSESSMENT & PLAN:    1. 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  He has had no recurrences.  Continue present dose of Toprol.  We can consider referral for ablation in the future if he has more frequent episodes.  I will arrange an echocardiogram to assess LV function. 2. Chest pain-patient did have chest discomfort with his SVT. Troponins not consistent with acute coronary syndrome.  Given history of diabetes mellitus we will arrange a stress nuclear study once COVID pandemic improved. 3. Hypertension-continue present regimen and follow. 4. Abnormal electrocardiogram-ECG in sinus rhythm showed conduction abnormalities and prior septal infarct cannot be excluded.  As outlined above we will arrange echocardiogram to assess LV  function and wall motion and nuclear study to screen for ischemia.  COVID-19 Education: The importance of social distancing was discussed today.  Time:   Today, I have spent 20 minutes with the patient with telehealth technology discussing the above problems.     Medication Adjustments/Labs and Tests Ordered: Current medicines are reviewed at length with the patient today.  Concerns regarding medicines are outlined above.   Tests Ordered: No orders of the defined types were placed in this encounter.   Medication Changes: No orders of the defined types were placed in this encounter.   Disposition:  Follow up in 6 month(s)  Signed, Kirk Ruths, MD  11/23/2018 9:13 AM    [facility]

## 2018-11-22 ENCOUNTER — Telehealth: Payer: Self-pay | Admitting: Cardiology

## 2018-11-22 NOTE — Telephone Encounter (Signed)
Patient called back and said he doesn't want a video visit, he wants a telephone visit

## 2018-11-22 NOTE — Telephone Encounter (Signed)
New Message   Patient has virtual appointment needs someone to call him to help setup.

## 2018-11-22 NOTE — Telephone Encounter (Signed)
Pt reports that he prefers to have his visit with Dr. [name] tomorrow as just a telephone visit and not a Video visit.. I explained to pro's but he still declined.. he understands to have his meds and any VS available. He gave CONSENT and to call [phone]     Virtual Visit Pre-Appointment Phone Call  1. "What is the BEST phone number to call the day of the visit?" - include this in appointment notes... [phone]  2. "Do you have or have access to (through a family member/friend) a smartphone with video capability that we can use for your visit?  Pt Prefers Phone Call Only  3. Confirm consent - "In the setting of the current Covid19 crisis, you are scheduled for a (phone or video) visit with your provider on 11/23/18. Just as we do with many in-office visits, in order for you to participate in this visit, we must obtain consent.  If you'd like, I can send this to your mychart (if signed up) or email for you to review.  Otherwise, I can obtain your verbal consent now.  All virtual visits are billed to your insurance company just like a normal visit would be.  By agreeing to a virtual visit, we'd like you to understand that the technology does not allow for your provider to perform an examination, and thus may limit your provider's ability to fully assess your condition. If your provider identifies any concerns that need to be evaluated in person, we will make arrangements to do so.  Finally, though the technology is pretty good, we cannot assure that it will always work on either your or our end, and in the setting of a video visit, we may have to convert it to a phone-only visit.  In either situation, we cannot ensure that we have a secure connection.  Are you willing to proceed?" STAFF: Did the patient verbally acknowledge consent to telehealth visit? Document YES/NO here: YES  4. Advise patient to be prepared - "Two hours prior to your appointment, go ahead and check your blood pressure, pulse,  oxygen saturation, and your weight (if you have the equipment to check those) and write them all down. When your visit starts, your provider will ask you for this information. If you have an Apple Watch or Kardia device, please plan to have heart rate information ready on the day of your appointment. Please have a pen and paper handy nearby the day of the visit as well."  5. Give patient instructions for MyChart download to smartphone OR [email or web address] as below if video visit (depending on what platform provider is using)  6. Inform patient they will receive a phone call 15 minutes prior to their appointment time (may be from unknown caller ID) so they should be prepared to answer    TELEPHONE CALL NOTE  Dalton Ochoa has been deemed a candidate for a follow-up tele-health visit to limit community exposure during the Covid-19 pandemic. I spoke with the patient via phone to ensure availability of phone/video source, confirm preferred email & phone number, and discuss instructions and expectations.  I reminded Dalton Ochoa to be prepared with any vital sign and/or heart rhythm information that could potentially be obtained via home monitoring, at the time of his visit. I reminded Dalton Ochoa to expect a phone call prior to his visit.  Stephani Police, RN 11/22/2018 11:16 AM    IF USING DOXIMITY or DOXY.ME - The patient will receive a link just  prior to their visit by text.     FULL LENGTH CONSENT FOR TELE-HEALTH VISIT   I hereby voluntarily request, consent and authorize [facility] and its employed or contracted physicians, physician assistants, nurse practitioners or other licensed health care professionals (the Practitioner), to provide me with telemedicine health care services (the "Services") as deemed necessary by the treating Practitioner. I acknowledge and consent to receive the Services by the Practitioner via telemedicine. I understand that the telemedicine visit will  involve communicating with the Practitioner through live audiovisual communication technology and the disclosure of certain medical information by electronic transmission. I acknowledge that I have been given the opportunity to request an in-person assessment or other available alternative prior to the telemedicine visit and am voluntarily participating in the telemedicine visit.  I understand that I have the right to withhold or withdraw my consent to the use of telemedicine in the course of my care at any time, without affecting my right to future care or treatment, and that the Practitioner or I may terminate the telemedicine visit at any time. I understand that I have the right to inspect all information obtained and/or recorded in the course of the telemedicine visit and may receive copies of available information for a reasonable fee.  I understand that some of the potential risks of receiving the Services via telemedicine include:  [name] Delay or interruption in medical evaluation due to technological equipment failure or disruption; . Information transmitted may not be sufficient (e.g. poor resolution of images) to allow for appropriate medical decision making by the Practitioner; and/or  . In rare instances, security protocols could fail, causing a breach of personal health information.  Furthermore, I acknowledge that it is my responsibility to provide information about my medical history, conditions and care that is complete and accurate to the best of my ability. I acknowledge that Practitioner's advice, recommendations, and/or decision may be based on factors not within their control, such as incomplete or inaccurate data provided by me or distortions of diagnostic images or specimens that may result from electronic transmissions. I understand that the practice of medicine is not an exact science and that Practitioner makes no warranties or guarantees regarding treatment outcomes. I acknowledge that  I will receive a copy of this consent concurrently upon execution via email to the email address I last provided but may also request a printed copy by calling the office of [facility].    I understand that my insurance will be billed for this visit.   I have read or had this consent read to me. . I understand the contents of this consent, which adequately explains the benefits and risks of the Services being provided via telemedicine.  . I have been provided ample opportunity to ask questions regarding this consent and the Services and have had my questions answered to my satisfaction. . I give my informed consent for the services to be provided through the use of telemedicine in my medical care  By participating in this telemedicine visit I agree to the above.

## 2018-11-22 NOTE — Telephone Encounter (Signed)
LMTCB

## 2018-11-22 NOTE — Telephone Encounter (Signed)
Smartphone/ my chart/ consent/ pre reg completed °

## 2018-11-23 ENCOUNTER — Encounter: Payer: Self-pay | Admitting: *Deleted

## 2018-11-23 ENCOUNTER — Other Ambulatory Visit: Payer: Self-pay | Admitting: *Deleted

## 2018-11-23 ENCOUNTER — Telehealth (INDEPENDENT_AMBULATORY_CARE_PROVIDER_SITE_OTHER): Payer: Managed Care, Other (non HMO) | Admitting: Cardiology

## 2018-11-23 ENCOUNTER — Encounter: Payer: Self-pay | Admitting: Cardiology

## 2018-11-23 VITALS — Ht 70.0 in | Wt 232.0 lb

## 2018-11-23 DIAGNOSIS — R072 Precordial pain: Secondary | ICD-10-CM

## 2018-11-23 DIAGNOSIS — R0600 Dyspnea, unspecified: Secondary | ICD-10-CM

## 2018-11-23 DIAGNOSIS — I10 Essential (primary) hypertension: Secondary | ICD-10-CM

## 2018-11-23 DIAGNOSIS — I471 Supraventricular tachycardia: Secondary | ICD-10-CM

## 2018-11-23 MED ORDER — METOPROLOL SUCCINATE ER 25 MG PO TB24: 25.0000 mg | ORAL_TABLET | Freq: Every day | ORAL | 3 refills | Status: DC

## 2018-11-23 NOTE — Patient Instructions (Signed)
Medication Instructions:  NO CHANGE If you need a refill on your cardiac medications before your next appointment, please call your pharmacy.   Lab work: If you have labs (blood work) drawn today and your tests are completely normal, you will receive your results only by: [name] MyChart Message (if you have MyChart) OR . A paper copy in the mail If you have any lab test that is abnormal or we need to change your treatment, we will call you to review the results.  Testing/Procedures: Your physician has requested that you have an echocardiogram. Echocardiography is a painless test that uses sound waves to create images of your heart. It provides your doctor with information about the size and shape of your heart and how well your heart's chambers and valves are working. This procedure takes approximately one hour. There are no restrictions for this procedure.  [facility] has requested that you have a lexiscan myoview. For further information please visit [email or web address]. Please follow instruction sheet, as given.  [place]  Follow-Up: At [facility], you and your health needs are our priority.  As part of our continuing mission to provide you with exceptional heart care, we have created designated Provider Care Teams.  These Care Teams include your primary Cardiologist (physician) and Advanced Practice Providers (APPs -  Physician Assistants and Nurse Practitioners) who all work together to provide you with the care you need, when you need it. You will need a follow up appointment in 4-6 months.  Please call our office 2 months in advance to schedule this appointment.  You may see Kirk Ruths MD or one of the following Advanced Practice Providers on your designated Care Team:   Kerin Ransom, PA-C Roby Lofts, [place] . Sande Rives, PA-C

## 2018-12-07 ENCOUNTER — Telehealth: Payer: Self-pay | Admitting: Cardiology

## 2018-12-07 NOTE — Telephone Encounter (Signed)
Continue to follow BP at home [facility]

## 2018-12-07 NOTE — Telephone Encounter (Signed)
Spoke with the pt. Pt sts that this afternoon he had an episode of diaphoresis with a HR of 100bpm. Pt denies palpitations, dizzy/lightheadiness, pre-syncope or syncope, chest pain, sob. Pt sts that the episode occurred after he awoke from a nap.  He is taking Metoprolol 25mg  daily as prescribed. He reports feeling in a fog since the starting  Metoprolol. He does not ck his BP and HR regularly. I asked him to ck his BP while I held the line. Pt reports 134/100 100bpm. Pt sts that his symptoms are improved. The pt does also take 40mg  of Lasix daily and sts that he does not take in much fluids. Adv the pt to have a drink containing electrolytes ex-sugar free Gatorade.  Adv the pt that I will fwd an update to [facility] and we will call back with his recommendation. Adv the pt tot call back in the interim if symptoms reoccur. Pt agreeable with the plan and voiced appreciation for the call.

## 2018-12-07 NOTE — Telephone Encounter (Signed)
New message  STAT if HR is under 50 or over 120 (normal HR is 60-100 beats per minute)  1) What is your heart rate? Patient states that he is having a rapid hr 100 HR   2) Do you have a log of your heart rate readings (document readings)?no   Do you have any other symptoms? Sweating

## 2018-12-08 ENCOUNTER — Encounter (HOSPITAL_COMMUNITY): Payer: Self-pay | Admitting: Emergency Medicine

## 2018-12-08 ENCOUNTER — Emergency Department (HOSPITAL_COMMUNITY): Payer: Managed Care, Other (non HMO)

## 2018-12-08 ENCOUNTER — Other Ambulatory Visit: Payer: Self-pay

## 2018-12-08 ENCOUNTER — Telehealth: Payer: Self-pay | Admitting: Cardiology

## 2018-12-08 ENCOUNTER — Emergency Department (HOSPITAL_COMMUNITY)
Admission: EM | Admit: 2018-12-08 | Discharge: 2018-12-08 | Disposition: A | Discharge status: Home / Self Care | Payer: Managed Care, Other (non HMO) | Attending: Emergency Medicine | Admitting: Emergency Medicine

## 2018-12-08 DIAGNOSIS — I471 Supraventricular tachycardia: Secondary | ICD-10-CM | POA: Diagnosis not present

## 2018-12-08 DIAGNOSIS — Z79899 Other long term (current) drug therapy: Secondary | ICD-10-CM | POA: Diagnosis not present

## 2018-12-08 DIAGNOSIS — E119 Type 2 diabetes mellitus without complications: Secondary | ICD-10-CM | POA: Diagnosis not present

## 2018-12-08 DIAGNOSIS — Z7984 Long term (current) use of oral hypoglycemic drugs: Secondary | ICD-10-CM | POA: Insufficient documentation

## 2018-12-08 DIAGNOSIS — Z7901 Long term (current) use of anticoagulants: Secondary | ICD-10-CM | POA: Diagnosis not present

## 2018-12-08 DIAGNOSIS — R002 Palpitations: Secondary | ICD-10-CM | POA: Diagnosis present

## 2018-12-08 DIAGNOSIS — I10 Essential (primary) hypertension: Secondary | ICD-10-CM | POA: Insufficient documentation

## 2018-12-08 LAB — CBC
HCT: 53.5 % — ABNORMAL HIGH (ref 39.0–52.0)
Hemoglobin: 18.3 g/dL — ABNORMAL HIGH (ref 13.0–17.0)
MCH: 33.3 pg (ref 26.0–34.0)
MCHC: 34.2 g/dL (ref 30.0–36.0)
MCV: 97.4 fL (ref 80.0–100.0)
Platelets: 215 10*3/uL (ref 150–400)
RBC: 5.49 MIL/uL (ref 4.22–5.81)
RDW: 11.9 % (ref 11.5–15.5)
WBC: 6.5 10*3/uL (ref 4.0–10.5)
nRBC: 0 % (ref 0.0–0.2)

## 2018-12-08 LAB — BASIC METABOLIC PANEL
Anion gap: 11 (ref 5–15)
BUN: 15 mg/dL (ref 6–20)
CO2: 23 mmol/L (ref 22–32)
Calcium: 8.8 mg/dL — ABNORMAL LOW (ref 8.9–10.3)
Chloride: 101 mmol/L (ref 98–111)
Creatinine, Ser: 1.64 mg/dL — ABNORMAL HIGH (ref 0.61–1.24)
GFR calc Af Amer: 53 mL/min — ABNORMAL LOW (ref >60)
GFR calc non Af Amer: 46 mL/min — ABNORMAL LOW (ref >60)
Glucose, Bld: 112 mg/dL — ABNORMAL HIGH (ref 70–99)
Potassium: 4.7 mmol/L (ref 3.5–5.1)
Sodium: 135 mmol/L (ref 135–145)

## 2018-12-08 LAB — TROPONIN I: Troponin I: 0.04 ng/mL (ref <0.03)

## 2018-12-08 MED ORDER — ADENOSINE 6 MG/2ML IV SOLN
12.0000 mg | Freq: Once | INTRAVENOUS | Status: AC
  Administered 2018-12-08: 12 mg via INTRAVENOUS
  Filled 2018-12-08: qty 4

## 2018-12-08 MED ORDER — METOPROLOL SUCCINATE ER 25 MG PO TB24: 50.0000 mg | ORAL_TABLET | Freq: Every day | ORAL | 3 refills | Status: DC

## 2018-12-08 NOTE — ED Triage Notes (Signed)
Patient reports he began feeling sweaty and liike his heart was racing yesterday, called dr Lonia Skinner office and was told to push fluids/electrolyites. Pt reports his HR was 100 yesterday, he took a metoprolol at 6pm yesterday and then again at 0200 this morning with some relief, however, he still feels like his heart is racing. He denies sob or chest pain. Hx of SVT which was treated here with adenosine. Pt a/ox4, resp e/u, nad.

## 2018-12-08 NOTE — ED Notes (Signed)
This rn called to get status of when xray was going to be done, xray en route

## 2018-12-08 NOTE — Discharge Instructions (Signed)
The cardiologist office will call you with an appointment with the electrophysiology physician.  Increase your Toprol XL to 50 mg.  If you feel dizzy or lightheaded, drop back to 25 mg.

## 2018-12-08 NOTE — Telephone Encounter (Signed)
Spoke with pt wife, Aware of dr crenshaw's recommendations.  

## 2018-12-08 NOTE — ED Notes (Signed)
12mg  adenosine administered by [place], [place], dr Gilford Raid, this RN and Lovena Le, RN at bedside. Patient converted to sinus rhythm with rate of 94 Bpm

## 2018-12-08 NOTE — Telephone Encounter (Signed)
Spoke with pt who state he is still having c/o elevated HR. He state he is very winded and fatigue from walking up his stairs and has noticed he is sweating more at rest. He state last night around 6 pm, he took a 1/2 table of metoprolol and HR decreased to 89. He report waking up around 2 am this morning and taking another 1/2 tablet but at 5:30 am he didn't feel good and HR was 105. Informed pt,  Nurse will route message to MD for recommendations. Pt voiced understanding.

## 2018-12-08 NOTE — ED Notes (Signed)
ED Provider at bedside. 

## 2018-12-08 NOTE — Telephone Encounter (Signed)
  Patient is calling because he has an elevated heart rate. His HR this morning was 105. He spoke to a nurse yesterday and has tried the suggestions she gave him but it is still high. Please advise.

## 2018-12-08 NOTE — Telephone Encounter (Signed)
Pt updated with recommended and voiced understanding. Cardmaster notified.

## 2018-12-08 NOTE — Telephone Encounter (Signed)
ER eval (needs ECG and if recurrent SVT will need adenosine) Kirk Ruths

## 2018-12-08 NOTE — ED Provider Notes (Signed)
[facility] EMERGENCY DEPARTMENT Provider Note   CSN: [id number] Arrival date & time: 12/08/18  1042    History   Chief Complaint Chief Complaint  Patient presents with  . Tachycardia    HPI Dalton Ochoa is a 58 y.o. male.     Pt presents to the ED today with rapid HR.  I saw him on 4/22 for the same.  He was diagnosed with SVT and cardioverted with 12 mg of adenosine.  He had a telemedicine visit with Dr. [name] who told him to continue the Toprol that he's been taking.  The pt has an ECHO and stress test scheduled, but that is not until the end of June.  The pt noticed the palpitations yesterday.  He called cardiology and they told him to monitor things.  He took an extra toprol last night around 1800 and 1 around 0200.  He was finally able to rest.  When he woke up, his HR was elevated again.  He is on Xarelto for PE and has been compliant with that.      Past Medical History:  Diagnosis Date  . Diabetes mellitus without complication ([place])   . DVT (deep venous thrombosis) ([place])   . Gout   . Hyperlipidemia   . Hypertension   . Inguinal hernia   . Occasional tremors    Right hand  . Pulmonary emboli ([place]) 07/21/2005  . Raynaud's disease   . Varicose veins   . Venous stasis     Patient Active Problem List   Diagnosis Date Noted  . Tremor of right hand 09/09/2016  . Varicose veins of lower extremities with ulcer ([place]) 10/10/2014    Past Surgical History:  Procedure Laterality Date  . ENDOVENOUS ABLATION SAPHENOUS VEIN W/ LASER Right 10-10-2014   EVLA right greater saphenous vein by Curt Jews MD  . ENDOVENOUS ABLATION SAPHENOUS VEIN W/ LASER Left 12-05-2014   endovenous laser ablation left small saphenous vein by Curt Jews MD  . INGUINAL HERNIA REPAIR Right   . NESBIT PROCEDURE N/A 09/02/2017   Procedure: NESBIT PROCEDURE AND BLOCK;  Surgeon: [name], MD;  Location: [facility];  Service: Urology;  Laterality: N/A;   . TESTICLE REMOVAL Right         Home Medications    Prior to Admission medications   Medication Sig Start Date End Date Taking? Authorizing Provider  furosemide (LASIX) 20 MG tablet Take 20 mg by mouth daily.   Yes [provider]  METFORMIN HCL PO Take 500 mg by mouth daily.    Yes [provider]  pravastatin (PRAVACHOL) 20 MG tablet Take 20 mg by mouth daily.    Yes [provider]  primidone (MYSOLINE) 50 MG tablet Take 1 tablet (50 mg total) by mouth at bedtime. Must be seen prior to future refills. Please keep pending 09/07/18 appt. Patient taking differently: Take 50 mg by mouth daily.  09/07/18  Yes Dennie Bible, NP  XARELTO 20 MG TABS tablet Take 20 mg by mouth daily. 10/23/18  Yes [provider]  metoprolol succinate (TOPROL-XL) 25 MG 24 hr tablet Take 2 tablets (50 mg total) by mouth daily. 12/08/18   Isla Pence, MD    Family History Family History  Problem Relation Age of Onset  . Deep vein thrombosis Mother   . Hypertension Mother   . Diabetes Father   . Dilated cardiomyopathy Brother     Social History Social History   Tobacco  Use  . Smoking status: Never Smoker  . Smokeless tobacco: Never Used  Substance Use Topics  . Alcohol use: Yes    Alcohol/week: 0.0 standard drinks    Comment: occ  . Drug use: No     Allergies   Patient has no known allergies.   Review of Systems Review of Systems  Cardiovascular: Positive for palpitations.  All other systems reviewed and are negative.    Physical Exam Updated Vital Signs BP 117/72   Pulse 71   Temp (!) 97.5 F (36.4 C) (Oral)   Resp 17   SpO2 100%   Physical Exam Vitals signs and nursing note reviewed.  Constitutional:      Appearance: Normal appearance.  HENT:     Head: Normocephalic and atraumatic.     Right Ear: External ear normal.     Left Ear: External ear normal.     Nose: Nose normal.     Mouth/Throat:     Mouth: Mucous membranes are  moist.     Pharynx: Oropharynx is clear.  Eyes:     Extraocular Movements: Extraocular movements intact.     Conjunctiva/sclera: Conjunctivae normal.     Pupils: Pupils are equal, round, and reactive to light.  Neck:     Musculoskeletal: Normal range of motion and neck supple.  Cardiovascular:     Rate and Rhythm: Regular rhythm. Tachycardia present.     Pulses: Normal pulses.     Heart sounds: Normal heart sounds.  Pulmonary:     Effort: Pulmonary effort is normal.     Breath sounds: Normal breath sounds.  Abdominal:     General: Abdomen is flat. Bowel sounds are normal.     Palpations: Abdomen is soft.  Musculoskeletal: Normal range of motion.  Skin:    General: Skin is warm and dry.     Capillary Refill: Capillary refill takes less than 2 seconds.  Neurological:     General: No focal deficit present.     Mental Status: He is alert and oriented to person, place, and time.  Psychiatric:        Mood and Affect: Mood normal.        Behavior: Behavior normal.      ED Treatments / Results  Labs (all labs ordered are listed, but only abnormal results are displayed) Labs Reviewed  BASIC METABOLIC PANEL - Abnormal; Notable for the following components:      Result Value   Glucose, Bld 112 (*)    Creatinine, Ser 1.64 (*)    Calcium 8.8 (*)    GFR calc non Af Amer 46 (*)    GFR calc Af Amer 53 (*)    All other components within normal limits  CBC - Abnormal; Notable for the following components:   Hemoglobin 18.3 (*)    HCT 53.5 (*)    All other components within normal limits  TROPONIN I - Abnormal; Notable for the following components:   Troponin I 0.04 (*)    All other components within normal limits    EKG EKG Interpretation  Date/Time:  Friday Dec 08 2018 12:06:35 EDT Ventricular Rate:  73 PR Interval:    QRS Duration: 148 QT Interval:  454 QTC Calculation: 501 R Axis:   -135 Text Interpretation:  Sinus rhythm Prolonged PR interval Right bundle branch block  Baseline wander in lead(s) V3 after 12 mg adenosine Confirmed by Isla Pence [id number] on 12/08/2018 12:09:11 PM   Radiology Dg Chest [facility] 1 [address]  Result Date: 12/08/2018 CLINICAL DATA:  Tachycardia EXAM: PORTABLE CHEST 1 VIEW COMPARISON:  11/15/2018 FINDINGS: The heart size and mediastinal contours are within normal limits. Both lungs are clear. The visualized skeletal structures are unremarkable. IMPRESSION: No active disease. Electronically Signed   By: Inez Catalina M.D.   On: 12/08/2018 12:13    Procedures .Cardioversion Date/Time: 12/08/2018 12:32 PM Performed by: Isla Pence, MD Authorized by: Isla Pence, MD   Consent:    Consent obtained:  Verbal   Consent given by:  Patient   Alternatives discussed:  No treatment Pre-procedure details:    Cardioversion basis:  Emergent   Rhythm:  Supraventricular tachycardia Post-procedure details:    Patient status:  Awake   Patient tolerance of procedure:  Tolerated well, no immediate complications Comments:     Pt's SVT converted with 12 mg Adenosine.      (including critical care time)  Medications Ordered in ED Medications  adenosine (ADENOCARD) 6 MG/2ML injection 12 mg (12 mg Intravenous Given 12/08/18 1108)     Initial Impression / Assessment and Plan / ED Course  I have reviewed the triage vital signs and the nursing notes.  Pertinent labs & imaging results that were available during my care of the patient were reviewed by me and considered in my medical decision making (see chart for details).      Pt d/w Dr. Radford Pax (cardiology).  She recommended that pt increase his Toprol XL to 50 mg daily.  Pt told that if he feels dizzy or lightheaded to decrease back to 25 mg.  She asked that I contact Trish to get pt an appt with the EP doctor in the next week.  I spoke with Wannetta Sender and she will arrange that.  Cards office to call pt with the appt.  Pt knows to return if worse.    Final Clinical Impressions(s) / ED  Diagnoses   Final diagnoses:  SVT (supraventricular tachycardia) [facility])    ED Discharge Orders         Ordered    metoprolol succinate (TOPROL-XL) 25 MG 24 hr tablet  Daily     12/08/18 1228           Isla Pence, MD 12/08/18 1234

## 2018-12-11 ENCOUNTER — Telehealth: Payer: Self-pay | Admitting: Cardiology

## 2018-12-11 NOTE — Telephone Encounter (Signed)
Pt is scheduled for echo and myoview on 6/19 . Per pt ws just discharged from hospital  Does pt need an earlier appt no discharge summary at this time ?

## 2018-12-11 NOTE — Telephone Encounter (Signed)
New message:    Patient calling he just got out the hospital and would like to know if he need to come early for his appt. Please call patient.

## 2018-12-11 NOTE — Telephone Encounter (Signed)
Pt aware of recommendations and will forward message to Lorenda Hatchet to schedule pt's appt ./cy

## 2018-12-11 NOTE — Telephone Encounter (Signed)
Make sure pt has new patient evaluation scheduled with EP soon Dalton Ochoa

## 2018-12-13 ENCOUNTER — Telehealth: Payer: Self-pay | Admitting: *Deleted

## 2018-12-13 NOTE — Telephone Encounter (Signed)
EP appt scheduled.

## 2018-12-13 NOTE — Telephone Encounter (Signed)

## 2018-12-14 ENCOUNTER — Telehealth (INDEPENDENT_AMBULATORY_CARE_PROVIDER_SITE_OTHER): Payer: Managed Care, Other (non HMO) | Admitting: Cardiology

## 2018-12-14 ENCOUNTER — Other Ambulatory Visit: Payer: Self-pay

## 2018-12-14 ENCOUNTER — Telehealth: Payer: Managed Care, Other (non HMO) | Admitting: Cardiology

## 2018-12-14 DIAGNOSIS — I471 Supraventricular tachycardia: Secondary | ICD-10-CM

## 2018-12-14 NOTE — Progress Notes (Signed)
Virtual Visit via Video Note   This visit type was conducted due to national recommendations for restrictions regarding the COVID-19 Pandemic (e.g. social distancing) in an effort to limit this patient's exposure and mitigate transmission in our community.  Due to his co-morbid illnesses, this patient is at least at moderate risk for complications without adequate follow up.  This format is felt to be most appropriate for this patient at this time.  All issues noted in this document were discussed and addressed.  A limited physical exam was performed with this format.  Please refer to the patient's chart for his consent to telehealth for [facility].   Date:  12/14/2018   ID:  Dalton Ochoa, DOB May 19, 1961, MRN [id number]  Patient Location: Home Provider Location: Home  PCP:  Dalton Frees, MD  Cardiologist:  [name] Electrophysiologist:  None   Evaluation Performed:  Consultation - Dalton Ochoa was referred by Dalton Ochoa for the evaluation of SVT.  Chief Complaint:  SVT  History of Present Illness:    Dalton Ochoa is a 58 y.o. male with He has a history significant for diabetes, hypertension, hyperlipidemia.  He presented in April with palpitations, weakness, and chest heaviness.  ECG showed SVT with a right bundle branch block.  He was given adenosine which converted his rhythm to sinus.  The onset of his palpitations were sudden without exacerbating or alleviating factors.  He noted dyspnea while climbing stairs which was usual, but he had mild chest tightness.  All the symptoms resolved with adenosine.  Since that time he has done well.  His metoprolol was increased by the emergency room and he has tolerated that without issue.  He is also cut out caffeine.  He feels that these changes have improved his symptoms.  The patient does not have symptoms concerning for COVID-19 infection (fever, chills, cough, or new shortness of breath).    Past Medical History:   Diagnosis Date  . Diabetes mellitus without complication ([place])   . DVT (deep venous thrombosis) ([place])   . Gout   . Hyperlipidemia   . Hypertension   . Inguinal hernia   . Occasional tremors    Right hand  . Pulmonary emboli ([place]) 07/21/2005  . Raynaud's disease   . Varicose veins   . Venous stasis    Past Surgical History:  Procedure Laterality Date  . ENDOVENOUS ABLATION SAPHENOUS VEIN W/ LASER Right 10-10-2014   EVLA right greater saphenous vein by Curt Jews MD  . ENDOVENOUS ABLATION SAPHENOUS VEIN W/ LASER Left 12-05-2014   endovenous laser ablation left small saphenous vein by Curt Jews MD  . INGUINAL HERNIA REPAIR Right   . NESBIT PROCEDURE N/A 09/02/2017   Procedure: NESBIT PROCEDURE AND BLOCK;  Surgeon: [name], MD;  Location: [facility];  Service: Urology;  Laterality: N/A;  . TESTICLE REMOVAL Right      Current Meds  Medication Sig  . furosemide (LASIX) 20 MG tablet Take 20 mg by mouth daily.  [name] METFORMIN HCL PO Take 500 mg by mouth daily.   . metoprolol succinate (TOPROL-XL) 25 MG 24 hr tablet Take 2 tablets (50 mg total) by mouth daily.  . pravastatin (PRAVACHOL) 20 MG tablet Take 20 mg by mouth daily.   . primidone (MYSOLINE) 50 MG tablet Take 1 tablet (50 mg total) by mouth at bedtime. Must be seen prior to future refills. Please keep pending 09/07/18 appt. (Patient taking differently: Take 50 mg by mouth daily. )  .  XARELTO 20 MG TABS tablet Take 20 mg by mouth daily.     Allergies:   Patient has no known allergies.   Social History   Tobacco Use  . Smoking status: Never Smoker  . Smokeless tobacco: Never Used  Substance Use Topics  . Alcohol use: Yes    Alcohol/week: 0.0 standard drinks    Comment: occ  . Drug use: No     Family Hx: The patient's family history includes Deep vein thrombosis in his mother; Diabetes in his father; Dilated cardiomyopathy in his brother; Hypertension in his mother.  ROS:   Please see the history  of present illness.     All other systems reviewed and are negative.   Prior CV studies:   The following studies were reviewed today:    Labs/Other Tests and Data Reviewed:    EKG:  An ECG dated 10/28/2018 was personally reviewed today and demonstrated:  SVT, right bundle branch block  Recent Labs: 11/15/2018: TSH 1.413 12/08/2018: BUN 15; Creatinine, Ser 1.64; Hemoglobin 18.3; Platelets 215; Potassium 4.7; Sodium 135   Recent Lipid Panel No results found for: CHOL, TRIG, HDL, CHOLHDL, LDLCALC, LDLDIRECT  Wt Readings from Last 3 Encounters:  11/23/18 232 lb (105.2 kg)  11/15/18 228 lb (103.4 kg)  09/07/18 234 lb 3.2 oz (106.2 kg)     Objective:    Vital Signs:  BP 132/84    VITAL SIGNS:  reviewed GEN:  no acute distress EYES:  sclerae anicteric, EOMI - Extraocular Movements Intact RESPIRATORY:  normal respiratory effort, symmetric expansion CARDIOVASCULAR:  no peripheral edema SKIN:  no rash, lesions or ulcers. MUSCULOSKELETAL:  no obvious deformities. NEURO:  alert and oriented x 3, no obvious focal deficit PSYCH:  normal affect  ASSESSMENT & PLAN:    1. SVT: Has a right bundle branch block and thus his mechanism of SVT is difficult to interpret.  He does have a right bundle branch block and thus P wave morphology is difficult to identify and thus it is possible that he has either AVNRT or ORT.  I discussed with him the possibility of continued medical management versus ablation.  Risks and benefits of ablation were discussed and include bleeding, tamponade, heart block, stroke.  The patient understands these risks and is agreed to the procedure.  In the interim, we [name] continue his metoprolol at the current dose.  The dose was increased in the emergency room and he has not had any further episodes.  He is also been instructed to avoid caffeine intake.  COVID-19 Education: The signs and symptoms of COVID-19 were discussed with the patient and how to seek care for testing  (follow up with PCP or arrange E-visit).  The importance of social distancing was discussed today.  Time:   Today, I have spent 11 minutes with the patient with telehealth technology discussing the above problems.     Medication Adjustments/Labs and Tests Ordered: Current medicines are reviewed at length with the patient today.  Concerns regarding medicines are outlined above.   Tests Ordered: No orders of the defined types were placed in this encounter.   Medication Changes: No orders of the defined types were placed in this encounter.  Case discussed with primary cardiology  Disposition:  Follow up in 3 month(s)  Signed, Linder Prajapati Meredith Leeds, MD  12/14/2018 3:22 PM    [facility]

## 2019-01-06 ENCOUNTER — Emergency Department (HOSPITAL_COMMUNITY)
Admission: EM | Admit: 2019-01-06 | Discharge: 2019-01-06 | Disposition: A | Discharge status: Home / Self Care | Payer: Managed Care, Other (non HMO) | Attending: Emergency Medicine | Admitting: Emergency Medicine

## 2019-01-06 ENCOUNTER — Other Ambulatory Visit: Payer: Self-pay

## 2019-01-06 ENCOUNTER — Encounter (HOSPITAL_COMMUNITY): Payer: Self-pay

## 2019-01-06 DIAGNOSIS — Z79899 Other long term (current) drug therapy: Secondary | ICD-10-CM | POA: Insufficient documentation

## 2019-01-06 DIAGNOSIS — Z86718 Personal history of other venous thrombosis and embolism: Secondary | ICD-10-CM | POA: Insufficient documentation

## 2019-01-06 DIAGNOSIS — Z86711 Personal history of pulmonary embolism: Secondary | ICD-10-CM | POA: Insufficient documentation

## 2019-01-06 DIAGNOSIS — I471 Supraventricular tachycardia: Secondary | ICD-10-CM | POA: Diagnosis not present

## 2019-01-06 DIAGNOSIS — E119 Type 2 diabetes mellitus without complications: Secondary | ICD-10-CM | POA: Diagnosis not present

## 2019-01-06 DIAGNOSIS — Z7901 Long term (current) use of anticoagulants: Secondary | ICD-10-CM | POA: Diagnosis not present

## 2019-01-06 DIAGNOSIS — R002 Palpitations: Secondary | ICD-10-CM | POA: Diagnosis present

## 2019-01-06 DIAGNOSIS — I10 Essential (primary) hypertension: Secondary | ICD-10-CM | POA: Diagnosis not present

## 2019-01-06 LAB — I-STAT CHEM 8, ED
BUN: 25 mg/dL — ABNORMAL HIGH (ref 6–20)
Calcium, Ion: 1.01 mmol/L — ABNORMAL LOW (ref 1.15–1.40)
Chloride: 103 mmol/L (ref 98–111)
Creatinine, Ser: 1.5 mg/dL — ABNORMAL HIGH (ref 0.61–1.24)
Glucose, Bld: 114 mg/dL — ABNORMAL HIGH (ref 70–99)
HCT: 56 % — ABNORMAL HIGH (ref 39.0–52.0)
Hemoglobin: 19 g/dL — ABNORMAL HIGH (ref 13.0–17.0)
Potassium: 5.3 mmol/L — ABNORMAL HIGH (ref 3.5–5.1)
Sodium: 140 mmol/L (ref 135–145)
TCO2: 31 mmol/L (ref 22–32)

## 2019-01-06 LAB — I-STAT TROPONIN, ED: Troponin i, poc: 0.04 ng/mL (ref 0.00–0.08)

## 2019-01-06 LAB — BASIC METABOLIC PANEL
Anion gap: 7 (ref 5–15)
BUN: 15 mg/dL (ref 6–20)
CO2: 24 mmol/L (ref 22–32)
Calcium: 8.5 mg/dL — ABNORMAL LOW (ref 8.9–10.3)
Chloride: 106 mmol/L (ref 98–111)
Creatinine, Ser: 1.32 mg/dL — ABNORMAL HIGH (ref 0.61–1.24)
GFR calc Af Amer: >60 mL/min (ref >60)
GFR calc non Af Amer: 60 mL/min — ABNORMAL LOW (ref >60)
Glucose, Bld: 124 mg/dL — ABNORMAL HIGH (ref 70–99)
Potassium: 3.8 mmol/L (ref 3.5–5.1)
Sodium: 137 mmol/L (ref 135–145)

## 2019-01-06 MED ORDER — ADENOSINE 6 MG/2ML IV SOLN
INTRAVENOUS | Status: AC
  Administered 2019-01-06: 12:00:00 6 mg
  Filled 2019-01-06: qty 2

## 2019-01-06 MED ORDER — METOPROLOL TARTRATE 5 MG/5ML IV SOLN
INTRAVENOUS | Status: AC
  Administered 2019-01-06: 5 mg
  Filled 2019-01-06: qty 5

## 2019-01-06 NOTE — ED Triage Notes (Signed)
Pt arrives POV from home c/o elevated heart rate since this morning. Pt states he was having "a frosty" when his heart rate went up. Pt's hx includes a cardioversion in the past, DM, and HTN. Pt a&o x4. Pt in ST with 140 bpm.

## 2019-01-06 NOTE — ED Provider Notes (Signed)
[place] EMERGENCY DEPARTMENT Provider Note   CSN: [id number] Arrival date & time: 01/06/19  1124     History   Chief Complaint Chief Complaint  Patient presents with  . possible SVT    HPI Dalton Ochoa is a 58 y.o. male.     HPI D45-year-old male, history of diabetes, history of DVT presents today complaining of palpitations.  He states he has had SVT 4 times over the past 4 weeks and schedule for ablation.  He states he is scheduled to have an echocardiogram and stress test on next Friday.  He noticed palpitations today.  He has no associated symptoms with this.  He denies fever, cough, dyspnea, chest pain, lightheadedness, nausea, vomiting, or diarrhea.  He has had no known covert exposures.  Review of records reveals TSH 1.413 on 11/15/2018 Past Medical History:  Diagnosis Date  . Diabetes mellitus without complication ([place])   . DVT (deep venous thrombosis) ([place])   . Gout   . Hyperlipidemia   . Hypertension   . Inguinal hernia   . Occasional tremors    Right hand  . Pulmonary emboli ([place]) 07/21/2005  . Raynaud's disease   . Varicose veins   . Venous stasis     Patient Active Problem List   Diagnosis Date Noted  . Tremor of right hand 09/09/2016  . Varicose veins of lower extremities with ulcer ([place]) 10/10/2014    Past Surgical History:  Procedure Laterality Date  . ENDOVENOUS ABLATION SAPHENOUS VEIN W/ LASER Right 10-10-2014   EVLA right greater saphenous vein by Curt Jews MD  . ENDOVENOUS ABLATION SAPHENOUS VEIN W/ LASER Left 12-05-2014   endovenous laser ablation left small saphenous vein by Curt Jews MD  . INGUINAL HERNIA REPAIR Right   . NESBIT PROCEDURE N/A 09/02/2017   Procedure: NESBIT PROCEDURE AND BLOCK;  Surgeon: [name], MD;  Location: [facility];  Service: Urology;  Laterality: N/A;  . TESTICLE REMOVAL Right         Home Medications    Prior to Admission medications   Medication Sig Start  Date End Date Taking? Authorizing Provider  furosemide (LASIX) 20 MG tablet Take 20 mg by mouth daily.    [provider]  METFORMIN HCL PO Take 500 mg by mouth daily.     [provider]  metoprolol succinate (TOPROL-XL) 25 MG 24 hr tablet Take 2 tablets (50 mg total) by mouth daily. 12/08/18   Isla Pence, MD  pravastatin (PRAVACHOL) 20 MG tablet Take 20 mg by mouth daily.     [provider]  primidone (MYSOLINE) 50 MG tablet Take 1 tablet (50 mg total) by mouth at bedtime. Must be seen prior to future refills. Please keep pending 09/07/18 appt. Patient taking differently: Take 50 mg by mouth daily.  09/07/18   Dennie Bible, NP  XARELTO 20 MG TABS tablet Take 20 mg by mouth daily. 10/23/18   [provider]    Family History Family History  Problem Relation Age of Onset  . Deep vein thrombosis Mother   . Hypertension Mother   . Diabetes Father   . Dilated cardiomyopathy Brother     Social History Social History   Tobacco Use  . Smoking status: Never Smoker  . Smokeless tobacco: Never Used  Substance Use Topics  . Alcohol use: Yes    Alcohol/week: 0.0 standard drinks    Comment: (2) 16 oz beers every other week  . Drug use:  No     Allergies   Patient has no known allergies.   Review of Systems Review of Systems  All other systems reviewed and are negative.    Physical Exam Updated Vital Signs BP (!) 170/93   Pulse 95   Temp 98.2 F (36.8 C) (Oral)   Resp 20   Ht 1.803 m (5\' 11" )   Wt 108.9 kg   SpO2 99%   BMI 33.47 kg/m   Physical Exam   ED Treatments / Results  Labs (all labs ordered are listed, but only abnormal results are displayed) Labs Reviewed  I-STAT TROPONIN, ED  I-STAT CHEM 8, ED    EKG EKG Interpretation  Date/Time:  Saturday January 06 2019 11:33:54 EDT Ventricular Rate:  138 PR Interval:    QRS Duration: 152 QT Interval:  343 QTC Calculation: 520 R Axis:   -131 Text Interpretation:   Supraventricular tachycardia Intraventricular conduction delay Confirmed by Pattricia Boss [phone]) on 01/06/2019 2:01:57 PM  ED ECG REPORT   Date: 01/06/2019  Rate: 82  Rhythm: normal sinus rhythm and sinus tachycardia  QRS Axis: normal  Intervals: normal  ST/T Wave abnormalities: normal  Conduction Disutrbances:right bundle branch block  Narrative Interpretation:   Old EKG Reviewed: unchanged  I have personally reviewed the EKG tracing and agree with the computerized printout as noted.  Radiology No results found.  Procedures Procedures (including critical care time)  Medications Ordered in ED Medications  adenosine (ADENOCARD) 6 MG/2ML injection (6 mg  Given 01/06/19 1146)  metoprolol tartrate (LOPRESSOR) 5 MG/5ML injection (5 mg  Given 01/06/19 1154)     Initial Impression / Assessment and Plan / ED Course  I have reviewed the triage vital signs and the nursing notes.  Pertinent labs & imaging results that were available during my care of the patient were reviewed by me and considered in my medical decision making (see chart for details).    Patient presents with svt resolved here with iv adenosine and iv lopressor Stable renal insufficiency Patient with cardiology f/u in place  Final Clinical Impressions(s) / ED Diagnoses   Final diagnoses:  SVT (supraventricular tachycardia) [facility])    ED Discharge Orders    None       Pattricia Boss, MD 01/06/19 1407

## 2019-01-06 NOTE — Discharge Instructions (Addendum)
Please continue your home medications as prescribed Call  your cardiologist for follow up with your heart doctor Return if worse at any time.

## 2019-01-06 NOTE — ED Notes (Signed)
Patient verbalizes understanding of discharge instructions. Opportunity for questioning and answers were provided. Armband removed by staff, pt discharged from ED.  

## 2019-01-06 NOTE — ED Notes (Signed)
ED Provider at bedside. 

## 2019-01-08 ENCOUNTER — Telehealth: Payer: Self-pay | Admitting: Cardiology

## 2019-01-08 NOTE — Telephone Encounter (Signed)
Pt called to let Dr. [name] and Dr. Curt Bears know that he was in the ER Saturday 01/06/19 with SVT.. his rate was 160 and 150 after valsalva man, coughing, and taking an extra metoprolol. He was given IV Lopressor and adenosine in the ER for relief.   Pt says he feels well today.. he has been instructed to increase his Metoprolol to 50mg  a day.   He is ready for his stress test and echo 01/12/19.[name] will call if he develops any problems prior to these appts.

## 2019-01-08 NOTE — Telephone Encounter (Signed)
New Message              Patient called to let Dr. [name] know that he expeience another SVT  This passed Saturday. Patient would like a call concerning this matter.

## 2019-01-09 ENCOUNTER — Encounter (HOSPITAL_COMMUNITY): Payer: Self-pay | Admitting: *Deleted

## 2019-01-09 ENCOUNTER — Telehealth (HOSPITAL_COMMUNITY): Payer: Self-pay | Admitting: *Deleted

## 2019-01-09 ENCOUNTER — Telehealth: Payer: Self-pay | Admitting: Cardiology

## 2019-01-09 ENCOUNTER — Other Ambulatory Visit: Payer: Self-pay

## 2019-01-09 ENCOUNTER — Emergency Department (HOSPITAL_COMMUNITY)
Admission: EM | Admit: 2019-01-09 | Discharge: 2019-01-09 | Disposition: A | Discharge status: Home / Self Care | Payer: Managed Care, Other (non HMO) | Attending: Emergency Medicine | Admitting: Emergency Medicine

## 2019-01-09 DIAGNOSIS — E119 Type 2 diabetes mellitus without complications: Secondary | ICD-10-CM | POA: Diagnosis not present

## 2019-01-09 DIAGNOSIS — Z7984 Long term (current) use of oral hypoglycemic drugs: Secondary | ICD-10-CM | POA: Diagnosis not present

## 2019-01-09 DIAGNOSIS — Z7901 Long term (current) use of anticoagulants: Secondary | ICD-10-CM | POA: Insufficient documentation

## 2019-01-09 DIAGNOSIS — I471 Supraventricular tachycardia: Secondary | ICD-10-CM

## 2019-01-09 DIAGNOSIS — I4510 Unspecified right bundle-branch block: Secondary | ICD-10-CM | POA: Diagnosis not present

## 2019-01-09 DIAGNOSIS — I10 Essential (primary) hypertension: Secondary | ICD-10-CM | POA: Insufficient documentation

## 2019-01-09 DIAGNOSIS — Z79899 Other long term (current) drug therapy: Secondary | ICD-10-CM | POA: Insufficient documentation

## 2019-01-09 DIAGNOSIS — R002 Palpitations: Secondary | ICD-10-CM | POA: Diagnosis present

## 2019-01-09 MED ORDER — ADENOSINE 6 MG/2ML IV SOLN
6.0000 mg | Freq: Once | INTRAVENOUS | Status: AC
  Administered 2019-01-09: 6 mg via INTRAVENOUS
  Filled 2019-01-09: qty 2

## 2019-01-09 MED ORDER — SODIUM CHLORIDE 0.9 % IV BOLUS
500.0000 mL | Freq: Once | INTRAVENOUS | Status: AC
  Administered 2019-01-09: 03:00:00 500 mL via INTRAVENOUS

## 2019-01-09 MED ORDER — METOPROLOL TARTRATE 25 MG PO TABS: 37.5000 mg | ORAL_TABLET | Freq: Two times a day (BID) | ORAL | 6 refills | Status: DC

## 2019-01-09 NOTE — Telephone Encounter (Signed)
Will forward to dr Curt Bears

## 2019-01-09 NOTE — ED Triage Notes (Addendum)
Pt bent down at work tonight, felt his heart start racing; hx of SVT; pulse 138; denies sob or CP; is taking metoprolol 50mg 

## 2019-01-09 NOTE — Telephone Encounter (Signed)
Offered pt 6/24 ablation date.  Pt agreeable.  Pt aware I will call Thursday to go over instructions.

## 2019-01-09 NOTE — Telephone Encounter (Signed)
Needs to be scheduled for ablation with Dr Curt Bears; multiple episodes of SVT Kirk Ruths

## 2019-01-09 NOTE — Telephone Encounter (Addendum)
Spoke with pt, he feels fine this morning. He reports feeling SOB and sweaty when his heart rate goes up. He was at work when it happened and so he went to the ER. He was changed to metoprolol tartrate 37.5 mg bid. He has a nuclear test Friday this week and then follow up in august. Val-salva maneuvers discussed with the patient. Will forward for dr [name] review

## 2019-01-09 NOTE — Telephone Encounter (Signed)
Patient given detailed instructions per Myocardial Perfusion Study Information Sheet for the test on 01/12/19 at 10:00. Patient notified to arrive 15 minutes early and that it is imperative to arrive on time for appointment to keep from having the test rescheduled.  If you need to cancel or reschedule your appointment, please call the office within 24 hours of your appointment. . Patient verbalized understanding.Dalton Ochoa

## 2019-01-09 NOTE — Telephone Encounter (Signed)
New Message:     Pt wanted you to know he had another SVT episodel this morning and had to go to the ER.. They changedd his Metoprolol to 37 mg in the morning and 37 mg at night.

## 2019-01-09 NOTE — ED Provider Notes (Signed)
[place] EMERGENCY DEPARTMENT Provider Note   CSN: [id number] Arrival date & time: 01/09/19  0247     History   Chief Complaint Chief Complaint  Patient presents with  . Tachycardia    HPI Dalton Ochoa is a 58 y.o. male.     Patient presents to the emergency department for evaluation of heart palpitations.  Patient reports that symptoms began suddenly while at work tonight.  He has a history of recurrent SVT with similar symptoms.  He denies shortness of breath, chest pain, dizziness, passing out.  Patient takes Toprol-XL 50 mg daily.  That was recently increased from 25 mg daily with a previous SVT episode.  He took an additional Toprol-XL tablet tonight before coming to the ER.     Past Medical History:  Diagnosis Date  . Diabetes mellitus without complication ([place])   . DVT (deep venous thrombosis) ([place])   . Gout   . Hyperlipidemia   . Hypertension   . Inguinal hernia   . Occasional tremors    Right hand  . Pulmonary emboli ([place]) 07/21/2005  . Raynaud's disease   . Varicose veins   . Venous stasis     Patient Active Problem List   Diagnosis Date Noted  . Tremor of right hand 09/09/2016  . Varicose veins of lower extremities with ulcer ([place]) 10/10/2014    Past Surgical History:  Procedure Laterality Date  . ENDOVENOUS ABLATION SAPHENOUS VEIN W/ LASER Right 10-10-2014   EVLA right greater saphenous vein by Curt Jews MD  . ENDOVENOUS ABLATION SAPHENOUS VEIN W/ LASER Left 12-05-2014   endovenous laser ablation left small saphenous vein by Curt Jews MD  . INGUINAL HERNIA REPAIR Right   . NESBIT PROCEDURE N/A 09/02/2017   Procedure: NESBIT PROCEDURE AND BLOCK;  Surgeon: [name], MD;  Location: [facility];  Service: Urology;  Laterality: N/A;  . TESTICLE REMOVAL Right         Home Medications    Prior to Admission medications   Medication Sig Start Date End Date Taking? Authorizing Provider  furosemide  (LASIX) 20 MG tablet Take 20 mg by mouth daily.   Yes [provider]  metFORMIN (GLUCOPHAGE) 500 MG tablet Take 500 mg by mouth daily. 11/07/18  Yes [provider]  pravastatin (PRAVACHOL) 20 MG tablet Take 20 mg by mouth daily.    Yes [provider]  primidone (MYSOLINE) 50 MG tablet Take 1 tablet (50 mg total) by mouth at bedtime. Must be seen prior to future refills. Please keep pending 09/07/18 appt. Patient taking differently: Take 50 mg by mouth daily.  09/07/18  Yes Dennie Bible, NP  XARELTO 20 MG TABS tablet Take 20 mg by mouth daily. 10/23/18  Yes [provider]  metoprolol succinate (TOPROL-XL) 25 MG 24 hr tablet Take 2 tablets (50 mg total) by mouth daily. 12/08/18 01/09/19 Yes Isla Pence, MD  metoprolol tartrate (LOPRESSOR) 25 MG tablet Take 1.5 tablets (37.5 mg total) by mouth 2 (two) times daily. 01/09/19   Orpah Greek, MD    Family History Family History  Problem Relation Age of Onset  . Deep vein thrombosis Mother   . Hypertension Mother   . Diabetes Father   . Dilated cardiomyopathy Brother     Social History Social History   Tobacco Use  . Smoking status: Never Smoker  . Smokeless tobacco: Never Used  Substance Use Topics  . Alcohol use: Yes    Alcohol/week: 0.0  standard drinks    Comment: (2) 16 oz beers every other week  . Drug use: No     Allergies   Patient has no known allergies.   Review of Systems Review of Systems  Cardiovascular: Positive for palpitations.  All other systems reviewed and are negative.    Physical Exam Updated Vital Signs BP (!) 143/93   Pulse 64   Temp 98.5 F (36.9 C) (Oral)   Resp 15   SpO2 100%   Physical Exam Vitals signs and nursing note reviewed.  Constitutional:      General: He is not in acute distress.    Appearance: Normal appearance. He is well-developed.  HENT:     Head: Normocephalic and atraumatic.     Right Ear: Hearing normal.     Left Ear:  Hearing normal.     Nose: Nose normal.  Eyes:     Conjunctiva/sclera: Conjunctivae normal.     Pupils: Pupils are equal, round, and reactive to light.  Neck:     Musculoskeletal: Normal range of motion and neck supple.  Cardiovascular:     Rate and Rhythm: Regular rhythm. Tachycardia present.     Heart sounds: S1 normal and S2 normal. No murmur. No friction rub. No gallop.   Pulmonary:     Effort: Pulmonary effort is normal. No respiratory distress.     Breath sounds: Normal breath sounds.  Chest:     Chest wall: No tenderness.  Abdominal:     General: Bowel sounds are normal.     Palpations: Abdomen is soft.     Tenderness: There is no abdominal tenderness. There is no guarding or rebound. Negative signs include Murphy's sign and McBurney's sign.     Hernia: No hernia is present.  Musculoskeletal: Normal range of motion.  Skin:    General: Skin is warm and dry.     Findings: No rash.  Neurological:     Mental Status: He is alert and oriented to person, place, and time.     GCS: GCS eye subscore is 4. GCS verbal subscore is 5. GCS motor subscore is 6.     Cranial Nerves: No cranial nerve deficit.     Sensory: No sensory deficit.     Coordination: Coordination normal.  Psychiatric:        Speech: Speech normal.        Behavior: Behavior normal.        Thought Content: Thought content normal.      ED Treatments / Results  Labs (all labs ordered are listed, but only abnormal results are displayed) Labs Reviewed - No data to display  EKG EKG Interpretation  Date/Time:  Tuesday January 09 2019 03:10:11 EDT Ventricular Rate:  134 PR Interval:    QRS Duration: 140 QT Interval:  356 QTC Calculation: 532 R Axis:   -135 Text Interpretation:  Junctional tachycardia Right bundle branch block ST depression, consider ischemia, diffuse lds Confirmed by Orpah Greek [phone]) on 01/09/2019 3:11:18 AM   EKG Interpretation  Date/Time:  Tuesday January 09 2019 03:30:36 EDT  Ventricular Rate:  84 PR Interval:    QRS Duration: 150 QT Interval:  393 QTC Calculation: 465 R Axis:   -129 Text Interpretation:  Sinus rhythm Prolonged PR interval Biatrial enlargement Right bundle branch block Confirmed by Orpah Greek [phone]) on 01/09/2019 5:43:22 AM       Radiology No results found.  Procedures .Cardioversion  Date/Time: 01/09/2019 5:42 AM Performed by: Orpah Greek, MD  Authorized by: Orpah Greek, MD   Consent:    Consent obtained:  Verbal   Consent given by:  Patient Universal protocol:    Procedure explained and questions answered to patient or proxy's satisfaction: yes     Immediately prior to procedure a time out was called: yes     Patient identity confirmed:  Verbally with patient Pre-procedure details:    Cardioversion basis:  Emergent   Rhythm:  Supraventricular tachycardia Patient sedated: No Post-procedure details:    Patient tolerance of procedure:  Tolerated well, no immediate complications Comments:     Patient administered adenosine 6 mg IV push with prompt conversion to normal sinus rhythm     (including critical care time)  Medications Ordered in ED Medications  adenosine (ADENOCARD) 6 MG/2ML injection 6 mg (6 mg Intravenous Given 01/09/19 0328)  sodium chloride 0.9 % bolus 500 mL (0 mLs Intravenous Stopped 01/09/19 0400)     Initial Impression / Assessment and Plan / ED Course  I have reviewed the triage vital signs and the nursing notes.  Pertinent labs & imaging results that were available during my care of the patient were reviewed by me and considered in my medical decision making (see chart for details).        Patient arrives in no acute distress.  EKG shows right bundle branch block with a regular tachycardia.  This is similar to previous EKGs.  Patient administered IV adenosine with conversion to normal sinus rhythm.  He was monitored and no further arrhythmia.  Discussed with on-call  cardiology.  Arrangements will be made for patient to follow-up with Dr. Curt Bears.  Recommend increasing Toprol.  I will change patient from Toprol-XL to twice daily Lopressor, as he might be having increased ectopy at night when the Toprol-XL is wearing off.  Increase to 37.5 mg twice daily.  Return for worsening or recurrent symptoms.  Final Clinical Impressions(s) / ED Diagnoses   Final diagnoses:  SVT (supraventricular tachycardia) [facility])    ED Discharge Orders         Ordered    metoprolol tartrate (LOPRESSOR) 25 MG tablet  2 times daily     01/09/19 0502           , Gwenyth Allegra, MD 01/09/19 [phone]

## 2019-01-11 ENCOUNTER — Telehealth (HOSPITAL_COMMUNITY): Payer: Self-pay

## 2019-01-11 NOTE — Telephone Encounter (Signed)

## 2019-01-11 NOTE — Telephone Encounter (Signed)
lmtcb to rv instructions and schedule COVID testing.

## 2019-01-11 NOTE — Telephone Encounter (Signed)
SVT ablation scheduled for 6/24. Procedure instructions reviewed with pt. COVID screening/testing scheduled for 6/20, instructions for this reviewed with pt. Post procedure f/u scheduled for 7/28. Patient verbalized understanding and agreeable to plan.

## 2019-01-12 ENCOUNTER — Telehealth: Payer: Self-pay | Admitting: Cardiology

## 2019-01-12 ENCOUNTER — Other Ambulatory Visit: Payer: Self-pay

## 2019-01-12 ENCOUNTER — Ambulatory Visit (HOSPITAL_COMMUNITY): Payer: Managed Care, Other (non HMO) | Attending: Internal Medicine

## 2019-01-12 ENCOUNTER — Ambulatory Visit (HOSPITAL_BASED_OUTPATIENT_CLINIC_OR_DEPARTMENT_OTHER): Payer: Managed Care, Other (non HMO)

## 2019-01-12 DIAGNOSIS — R0600 Dyspnea, unspecified: Secondary | ICD-10-CM

## 2019-01-12 DIAGNOSIS — R072 Precordial pain: Secondary | ICD-10-CM

## 2019-01-12 LAB — MYOCARDIAL PERFUSION IMAGING
LV dias vol: 117 mL (ref 62–150)
LV sys vol: 61 mL
Peak HR: 94 {beats}/min
Rest HR: 64 {beats}/min
SDS: 0
SRS: 0
SSS: 0
TID: 1.13

## 2019-01-12 LAB — ECHOCARDIOGRAM COMPLETE
Height: 71 in
Weight: 3840 oz

## 2019-01-12 MED ORDER — REGADENOSON 0.4 MG/5ML IV SOLN
0.4000 mg | Freq: Once | INTRAVENOUS | Status: AC
  Administered 2019-01-12: 0.4 mg via INTRAVENOUS

## 2019-01-12 MED ORDER — TECHNETIUM TC 99M TETROFOSMIN IV KIT
31.9000 | PACK | Freq: Once | INTRAVENOUS | Status: AC | PRN
  Administered 2019-01-12: 31.9 via INTRAVENOUS
  Filled 2019-01-12: qty 32

## 2019-01-12 MED ORDER — TECHNETIUM TC 99M TETROFOSMIN IV KIT
10.8000 | PACK | Freq: Once | INTRAVENOUS | Status: AC | PRN
  Administered 2019-01-12: 10.8 via INTRAVENOUS
  Filled 2019-01-12: qty 11

## 2019-01-12 NOTE — Telephone Encounter (Signed)
New Message:     Pt said where or what  department does he need to report to next week at [facility] for his test please? If he does not answer, please just leave a message.

## 2019-01-12 NOTE — Telephone Encounter (Signed)
Pt advised to arrive to [facility] main entrance A for procedure next week. Pt agreeable to plan.

## 2019-01-13 ENCOUNTER — Other Ambulatory Visit (HOSPITAL_COMMUNITY)
Admission: RE | Admit: 2019-01-13 | Discharge: 2019-01-13 | Disposition: A | Discharge status: Home / Self Care | Payer: Managed Care, Other (non HMO) | Source: Ambulatory Visit | Attending: Cardiology | Admitting: Cardiology

## 2019-01-13 DIAGNOSIS — Z1159 Encounter for screening for other viral diseases: Secondary | ICD-10-CM | POA: Insufficient documentation

## 2019-01-13 LAB — SARS CORONAVIRUS 2 (TAT 6-24 HRS): SARS Coronavirus 2: NEGATIVE

## 2019-01-15 ENCOUNTER — Telehealth: Payer: Self-pay | Admitting: *Deleted

## 2019-01-15 ENCOUNTER — Encounter: Payer: Self-pay | Admitting: Cardiology

## 2019-01-15 DIAGNOSIS — I10 Essential (primary) hypertension: Secondary | ICD-10-CM

## 2019-01-15 DIAGNOSIS — E859 Amyloidosis, unspecified: Secondary | ICD-10-CM

## 2019-01-15 NOTE — Telephone Encounter (Addendum)
Spoke with pt, results explained to patient. Amyloid order placed and sent to [facility] for scheduling.   ----- Message from Lelon Perla, MD sent at 01/12/2019  3:11 PM EDT ----- Normal LV function; severe LVH; LVH likely related to hypertension; given renal insuff will not pursue MRI; schedule nuclear PYP scan to R/O amyloid Kirk Ruths

## 2019-01-17 ENCOUNTER — Other Ambulatory Visit: Payer: Self-pay

## 2019-01-17 ENCOUNTER — Ambulatory Visit (HOSPITAL_COMMUNITY)
Admission: RE | Admit: 2019-01-17 | Discharge: 2019-01-17 | Disposition: A | Discharge status: Home / Self Care | Payer: Managed Care, Other (non HMO) | Attending: Cardiology | Admitting: Cardiology

## 2019-01-17 ENCOUNTER — Encounter (HOSPITAL_COMMUNITY)
Admission: RE | Disposition: A | Discharge status: Home / Self Care | Payer: Self-pay | Source: Home / Self Care | Attending: Cardiology

## 2019-01-17 DIAGNOSIS — Z7901 Long term (current) use of anticoagulants: Secondary | ICD-10-CM | POA: Diagnosis not present

## 2019-01-17 DIAGNOSIS — I8390 Asymptomatic varicose veins of unspecified lower extremity: Secondary | ICD-10-CM | POA: Insufficient documentation

## 2019-01-17 DIAGNOSIS — M109 Gout, unspecified: Secondary | ICD-10-CM | POA: Diagnosis not present

## 2019-01-17 DIAGNOSIS — Z9079 Acquired absence of other genital organ(s): Secondary | ICD-10-CM | POA: Insufficient documentation

## 2019-01-17 DIAGNOSIS — Z7984 Long term (current) use of oral hypoglycemic drugs: Secondary | ICD-10-CM | POA: Insufficient documentation

## 2019-01-17 DIAGNOSIS — Z79899 Other long term (current) drug therapy: Secondary | ICD-10-CM | POA: Diagnosis not present

## 2019-01-17 DIAGNOSIS — I10 Essential (primary) hypertension: Secondary | ICD-10-CM | POA: Diagnosis not present

## 2019-01-17 DIAGNOSIS — Z86718 Personal history of other venous thrombosis and embolism: Secondary | ICD-10-CM | POA: Diagnosis not present

## 2019-01-17 DIAGNOSIS — E785 Hyperlipidemia, unspecified: Secondary | ICD-10-CM | POA: Diagnosis not present

## 2019-01-17 DIAGNOSIS — E119 Type 2 diabetes mellitus without complications: Secondary | ICD-10-CM | POA: Diagnosis not present

## 2019-01-17 DIAGNOSIS — I471 Supraventricular tachycardia: Secondary | ICD-10-CM | POA: Insufficient documentation

## 2019-01-17 DIAGNOSIS — Z86711 Personal history of pulmonary embolism: Secondary | ICD-10-CM | POA: Insufficient documentation

## 2019-01-17 DIAGNOSIS — I7300 Raynaud's syndrome without gangrene: Secondary | ICD-10-CM | POA: Diagnosis not present

## 2019-01-17 HISTORY — PX: SVT ABLATION: EP1225

## 2019-01-17 LAB — CBC
HCT: 51.1 % (ref 39.0–52.0)
Hemoglobin: 17.3 g/dL — ABNORMAL HIGH (ref 13.0–17.0)
MCH: 32.9 pg (ref 26.0–34.0)
MCHC: 33.9 g/dL (ref 30.0–36.0)
MCV: 97.1 fL (ref 80.0–100.0)
Platelets: 213 10*3/uL (ref 150–400)
RBC: 5.26 MIL/uL (ref 4.22–5.81)
RDW: 12.5 % (ref 11.5–15.5)
WBC: 5.8 10*3/uL (ref 4.0–10.5)
nRBC: 0 % (ref 0.0–0.2)

## 2019-01-17 LAB — GLUCOSE, CAPILLARY
Glucose-Capillary: 96 mg/dL (ref 70–99)
Glucose-Capillary: 80 mg/dL (ref 70–99)

## 2019-01-17 LAB — POCT ACTIVATED CLOTTING TIME
Activated Clotting Time: 175 seconds
Activated Clotting Time: 197 seconds

## 2019-01-17 SURGERY — SVT ABLATION
Anesthesia: LOCAL

## 2019-01-17 MED ORDER — HEPARIN (PORCINE) IN NACL 1000-0.9 UT/500ML-% IV SOLN
INTRAVENOUS | Status: AC
  Filled 2019-01-17: qty 500

## 2019-01-17 MED ORDER — SODIUM CHLORIDE 0.9 % IV SOLN
INTRAVENOUS | Status: DC | PRN
  Administered 2019-01-17 (×2): 2 ug/min via INTRAVENOUS

## 2019-01-17 MED ORDER — MIDAZOLAM HCL 5 MG/5ML IJ SOLN
INTRAMUSCULAR | Status: DC | PRN
  Administered 2019-01-17 (×6): 1 mg via INTRAVENOUS
  Administered 2019-01-17: 2 mg via INTRAVENOUS
  Administered 2019-01-17: 1 mg via INTRAVENOUS

## 2019-01-17 MED ORDER — FENTANYL CITRATE (PF) 100 MCG/2ML IJ SOLN
INTRAMUSCULAR | Status: AC
  Filled 2019-01-17: qty 2

## 2019-01-17 MED ORDER — FENTANYL CITRATE (PF) 100 MCG/2ML IJ SOLN
INTRAMUSCULAR | Status: DC | PRN
  Administered 2019-01-17: 12.5 ug via INTRAVENOUS
  Administered 2019-01-17 (×4): 25 ug via INTRAVENOUS
  Administered 2019-01-17: 12.5 ug via INTRAVENOUS
  Administered 2019-01-17 (×2): 25 ug via INTRAVENOUS

## 2019-01-17 MED ORDER — MIDAZOLAM HCL 5 MG/5ML IJ SOLN
INTRAMUSCULAR | Status: AC
  Filled 2019-01-17: qty 5

## 2019-01-17 MED ORDER — BUPIVACAINE HCL (PF) 0.25 % IJ SOLN
INTRAMUSCULAR | Status: DC | PRN
  Administered 2019-01-17 (×2): 20 mL

## 2019-01-17 MED ORDER — SODIUM CHLORIDE 0.9 % IV SOLN
INTRAVENOUS | Status: DC
  Administered 2019-01-17: 10:00:00 via INTRAVENOUS

## 2019-01-17 MED ORDER — ISOPROTERENOL HCL 0.2 MG/ML IJ SOLN
INTRAMUSCULAR | Status: AC
  Filled 2019-01-17: qty 5

## 2019-01-17 MED ORDER — ONDANSETRON HCL 4 MG/2ML IJ SOLN: 4.0000 mg | Freq: Four times a day (QID) | INTRAMUSCULAR | Status: DC | PRN

## 2019-01-17 MED ORDER — BUPIVACAINE HCL (PF) 0.25 % IJ SOLN
INTRAMUSCULAR | Status: AC
  Filled 2019-01-17: qty 30

## 2019-01-17 MED ORDER — HEPARIN SODIUM (PORCINE) 1000 UNIT/ML IJ SOLN
INTRAMUSCULAR | Status: DC | PRN
  Administered 2019-01-17: 1000 [IU] via INTRAVENOUS
  Administered 2019-01-17: 12000 [IU] via INTRAVENOUS

## 2019-01-17 MED ORDER — HEPARIN (PORCINE) IN NACL 1000-0.9 UT/500ML-% IV SOLN
INTRAVENOUS | Status: DC | PRN
  Administered 2019-01-17 (×3): 500 mL

## 2019-01-17 SURGICAL SUPPLY — 20 items
BAG SNAP BAND KOVER 36X36 (MISCELLANEOUS) ×2 IMPLANT
CATH JOSEPH QUAD ALLRED 6F REP (CATHETERS) ×2 IMPLANT
CATH JOSEPHSON QUAD-ALLRED 6FR (CATHETERS) ×2 IMPLANT
CATH SMTCH THERMOCOOL SF DF (CATHETERS) ×2 IMPLANT
CATH SOUNDSTAR ECO REPROCESSED (CATHETERS) ×2 IMPLANT
CATH WEBSTER BI DIR CS D-F CRV (CATHETERS) ×2 IMPLANT
COVER SWIFTLINK CONNECTOR (BAG) ×2 IMPLANT
PACK EP LATEX FREE (CUSTOM PROCEDURE TRAY) ×1
PACK EP LF (CUSTOM PROCEDURE TRAY) ×1 IMPLANT
PAD PRO RADIOLUCENT 2001M-C (PAD) ×2 IMPLANT
PATCH CARTO3 (PAD) ×2 IMPLANT
SHEATH AVANTI 11F 11CM (SHEATH) ×2 IMPLANT
SHEATH BAYLIS SUREFLEX  M 8.5 (SHEATH) ×1
SHEATH BAYLIS SUREFLEX M 8.5 (SHEATH) ×1 IMPLANT
SHEATH BAYLIS TRANSSEPTAL 98CM (NEEDLE) ×2 IMPLANT
SHEATH PINNACLE 6F 10CM (SHEATH) ×4 IMPLANT
SHEATH PINNACLE 7F 10CM (SHEATH) ×2 IMPLANT
SHEATH PINNACLE 8F 10CM (SHEATH) ×2 IMPLANT
SHEATH PINNACLE 9F 10CM (SHEATH) ×2 IMPLANT
TUBING SMART ABLATE COOLFLOW (TUBING) ×2 IMPLANT

## 2019-01-17 NOTE — Progress Notes (Signed)
Site area: Left groin a 6 and 7 french venou8s sheath was removed by Tally Joe RN  Site Prior to Removal:  Level 0  Pressure Applied For 20 MINUTES    Bedrest Beginning at 1550p  Manual:   Yes.    Patient Status During Pull:  stable  Post Pull Groin Site:  Level 0  Post Pull Instructions Given:  Yes.    Post Pull Pulses Present:  Yes.    Dressing Applied:  Yes.    Comments:

## 2019-01-17 NOTE — Discharge Instructions (Signed)
Cardiac Ablation, Care After This sheet gives you information about how to care for yourself after your procedure. Your health care provider may also give you more specific instructions. If you have problems or questions, contact your health care provider. What can I expect after the procedure? After the procedure, it is common to have:  Bruising around your puncture site.  Tenderness around your puncture site.  Skipped heartbeats.  Tiredness (fatigue). Follow these instructions at home: Puncture site care   Follow instructions from your health care provider about how to take care of your puncture site. Make sure you: ? Wash your hands with soap and water before you change your bandage (dressing). If soap and water are not available, use hand sanitizer. ? Remove your dressing as told by your health care provider. In 24-48 hours ? Leave stitches (sutures), skin glue, or adhesive strips in place. These skin closures may need to stay in place for up to 2 weeks. If adhesive strip edges start to loosen and curl up, you may trim the loose edges. Do not remove adhesive strips completely unless your health care provider tells you to do that.  Check your puncture site every day for signs of infection. Check for: ? Redness, swelling, or pain. ? Fluid or blood. If your puncture site starts to bleed, lie down on your back, apply firm pressure to the area, and contact your health care provider. ? Warmth. ? Pus or a bad smell. Driving  Ask your health care provider when it is safe for you to drive again after the procedure.  Do not drive or use heavy machinery while taking prescription pain medicine.  Do not drive for 24 hours if you were given a medicine to help you relax (sedative) during your procedure. Activity  Avoid activities that take a lot of effort for at least 3 days after your procedure.  Do not lift anything that is heavier than 5 lb, or the limit that you are told, until your  health care provider says that it is safe. For 5-7 days  Return to your normal activities as told by your health care provider. Ask your health care provider what activities are safe for you. General instructions  Take over-the-counter and prescription medicines only as told by your health care provider.  Do not use any products that contain nicotine or tobacco, such as cigarettes and e-cigarettes. If you need help quitting, ask your health care provider.  Do not take baths, swim, or use a hot tub until your health care provider approves.  Do not drink alcohol for 24 hours after your procedure.  Keep all follow-up visits as told by your health care provider. This is important. Contact a health care provider if:  You have redness, mild swelling, or pain around your puncture site.  You have fluid or blood coming from your puncture site that stops after applying firm pressure to the area.  Your puncture site feels warm to the touch.  You have pus or a bad smell coming from your puncture site.  You have a fever.  You have chest pain or discomfort that spreads to your neck, jaw, or arm.  You are sweating a lot.  You feel nauseous.  You have a fast or irregular heartbeat.  You have shortness of breath.  You are dizzy or light-headed and feel the need to lie down.  You have pain or numbness in the arm or leg closest to your puncture site. Get help right away  if:  Your puncture site suddenly swells.  Your puncture site is bleeding and the bleeding does not stop after applying firm pressure to the area. These symptoms may represent a serious problem that is an emergency. Do not wait to see if the symptoms will go away. Get medical help right away. Call your local emergency services (911 in the [place] Do not drive yourself to the hospital. Summary  After the procedure, it is normal to have bruising and tenderness at the puncture site in your groin, neck, or forearm.  Check your  puncture site every day for signs of infection.  Get help right away if your puncture site is bleeding and the bleeding does not stop after applying firm pressure to the area. This is a medical emergency. This information is not intended to replace advice given to you by your health care provider. Make sure you discuss any questions you have with your health care provider. Document Released: 10/21/2016 Document Revised: 10/21/2016 Document Reviewed: 10/21/2016 Elsevier Interactive Patient Education  2019 [employer].

## 2019-01-17 NOTE — Progress Notes (Signed)
D/c instructions given to wife [place] via telephone.  All questions answered and Moan verbalized understanding

## 2019-01-17 NOTE — H&P (Signed)
Dalton Ochoa has presented today for surgery, with the diagnosis of SVT.  The various methods of treatment have been discussed with the patient and family. After consideration of risks, benefits and other options for treatment, the patient has consented to  Procedure(s): Catheter ablation as a surgical intervention .  Risks include but not limited to bleeding, tamponade, heart block, stroke, damage to surrounding organs, among others. The patient's history has been reviewed, patient examined, no change in status, stable for surgery.  I have reviewed the patient's chart and labs.  Questions were answered to the patient's satisfaction.    Will Curt Bears, MD 01/17/2019 11:01 AM

## 2019-01-17 NOTE — Progress Notes (Signed)
Site area: Right groin a 9 an 11 french venous sheath was removed Site Prior to Removal:  Level 0  Pressure Applied For 20 MINUTES    Bedrest Beginning at  1550p  Manual:   No.  Patient Status During Pull:  stable  Post Pull Groin Site:  Level 0  Post Pull Instructions Given:  Yes.    Post Pull Pulses Present:  Yes.    Dressing Applied:  Yes.    Comments:

## 2019-01-18 ENCOUNTER — Encounter (HOSPITAL_COMMUNITY): Payer: Self-pay | Admitting: Cardiology

## 2019-01-24 ENCOUNTER — Telehealth (HOSPITAL_COMMUNITY): Payer: Self-pay | Admitting: *Deleted

## 2019-01-24 NOTE — Telephone Encounter (Signed)
Close encounter 

## 2019-01-25 ENCOUNTER — Ambulatory Visit (HOSPITAL_COMMUNITY)
Admission: RE | Admit: 2019-01-25 | Discharge: 2019-01-25 | Disposition: A | Discharge status: Home / Self Care | Payer: Managed Care, Other (non HMO) | Source: Ambulatory Visit | Attending: Cardiology | Admitting: Cardiology

## 2019-01-25 ENCOUNTER — Other Ambulatory Visit: Payer: Self-pay

## 2019-01-25 DIAGNOSIS — I10 Essential (primary) hypertension: Secondary | ICD-10-CM | POA: Insufficient documentation

## 2019-01-25 DIAGNOSIS — E859 Amyloidosis, unspecified: Secondary | ICD-10-CM | POA: Diagnosis present

## 2019-01-25 MED ORDER — TECHNETIUM TC 99M PYROPHOSPHATE
19.0000 | Freq: Once | INTRAVENOUS | Status: AC
  Administered 2019-01-25: 19 via INTRAVENOUS

## 2019-02-01 NOTE — ED Provider Notes (Signed)
Addendum to my note of 01/06/2019 WDWN male NAD HR 130s BP 110/70 HEENT- normal Neck- no abnormality noted, no JVD CV tachycardiac with a regular rhythm Lungs cta Abdomen- soft, nttt Extremities - thready pulses, ow normal Neuro- normal, no deficits  CRITICAL CARE Performed by: Pattricia Boss Total critical care time: 45 minutes Critical care time was exclusive of separately billable procedures and treating other patients. Critical care was necessary to treat or prevent imminent or life-threatening deterioration. Critical care was time spent personally by me on the following activities: development of treatment plan with patient and/or surrogate as well as nursing, discussions with consultants, evaluation of patient's response to treatment, examination of patient, obtaining history from patient or surrogate, ordering and performing treatments and interventions, ordering and review of laboratory studies, ordering and review of radiographic studies, pulse oximetry and re-evaluation of patient's condition.     Pattricia Boss, MD 02/01/19 1352

## 2019-02-12 ENCOUNTER — Telehealth: Payer: Self-pay | Admitting: Cardiology

## 2019-02-12 NOTE — Telephone Encounter (Signed)
Patient called stating he thinks he was having another SVT, he wants to know if he can take more medication for it.

## 2019-02-12 NOTE — Telephone Encounter (Signed)
Spoke with patient who states his heart rate was >140 bpm earlier this morning. He states he took a total of 72.5 mg of metoprolol tartrate this morning. States HR is now approximately 120 bpm. States he is already on the way to the ER and prefers to go there for treatment. I advised him to follow-up as scheduled next week with Dr. Curt Bears and to call us back prior to that appointment with additional questions or concerns. He thanked me for the call.

## 2019-02-16 ENCOUNTER — Encounter: Payer: Self-pay | Admitting: *Deleted

## 2019-02-19 ENCOUNTER — Telehealth: Payer: Self-pay | Admitting: Cardiology

## 2019-02-19 NOTE — Telephone Encounter (Signed)

## 2019-02-20 ENCOUNTER — Ambulatory Visit: Payer: Managed Care, Other (non HMO) | Admitting: Cardiology

## 2019-02-20 ENCOUNTER — Encounter: Payer: Self-pay | Admitting: Cardiology

## 2019-02-20 ENCOUNTER — Other Ambulatory Visit: Payer: Self-pay

## 2019-02-20 VITALS — BP 144/86 | HR 65 | Ht 70.0 in | Wt 237.8 lb

## 2019-02-20 DIAGNOSIS — I471 Supraventricular tachycardia: Secondary | ICD-10-CM

## 2019-02-20 MED ORDER — CARVEDILOL 12.5 MG PO TABS: 12.5000 mg | ORAL_TABLET | Freq: Two times a day (BID) | ORAL | 6 refills | Status: DC

## 2019-02-20 NOTE — Progress Notes (Signed)
Electrophysiology Office Note   Date:  02/20/2019   ID:  Dalton Ochoa, DOB 14-Dec-1960, MRN [id number]  PCP:  [name], MD  Cardiologist: [name] Primary Electrophysiologist:  Carrah Eppolito Meredith Leeds, MD    No chief complaint on file.    History of Present Illness: Dalton Ochoa is a 58 y.o. male who is being seen today for the evaluation of SVT at the request of [name], MD. Presenting today for electrophysiology evaluation.  He has a history of SVT.  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  His slow pathway was ablated.  Today, he denies symptoms of palpitations, chest pain, shortness of breath, orthopnea, PND, lower extremity edema, claudication, dizziness, presyncope, syncope, bleeding, or neurologic sequela. The patient is tolerating medications without difficulties.  He is overall doing well.  He has no chest pain or shortness of breath.  He did get palpitations yesterday that improved with splashing cold water on his face.  Otherwise he is doing well.   Past Medical History:  Diagnosis Date  . Diabetes mellitus without complication ([place])   . DVT (deep venous thrombosis) ([place])   . Gout   . Hyperlipidemia   . Hypertension   . Inguinal hernia   . Occasional tremors    Right hand  . Pulmonary emboli ([place]) 07/21/2005  . Raynaud's disease   . Varicose veins   . Venous stasis    Past Surgical History:  Procedure Laterality Date  . ENDOVENOUS ABLATION SAPHENOUS VEIN W/ LASER Right 10-10-2014   EVLA right greater saphenous vein by Curt Jews MD  . ENDOVENOUS ABLATION SAPHENOUS VEIN W/ LASER Left 12-05-2014   endovenous laser ablation left small saphenous vein by Curt Jews MD  . INGUINAL HERNIA REPAIR Right   . NESBIT PROCEDURE N/A 09/02/2017   Procedure: NESBIT PROCEDURE AND BLOCK;  Surgeon: [name], MD;  [facility];  Service: Urology;  Laterality: N/A;  . SVT ABLATION  N/A 01/17/2019   Procedure: SVT ABLATION;  Surgeon: Constance Haw, MD;  Location: [place] [facility];  Service: Cardiovascular;  Laterality: N/A;  . TESTICLE REMOVAL Right      Current Outpatient Medications  Medication Sig Dispense Refill  . allopurinol (ZYLOPRIM) 100 MG tablet Take 1 tablet by mouth daily.    . furosemide (LASIX) 20 MG tablet Take 20 mg by mouth daily.    . metFORMIN (GLUCOPHAGE) 500 MG tablet Take 500 mg by mouth daily.    . metoprolol tartrate (LOPRESSOR) 25 MG tablet Take 1.5 tablets (37.5 mg total) by mouth 2 (two) times daily. 90 tablet 6  . pravastatin (PRAVACHOL) 20 MG tablet Take 20 mg by mouth daily.     . primidone (MYSOLINE) 50 MG tablet Take 1 tablet (50 mg total) by mouth at bedtime. Must be seen prior to future refills. Please keep pending 09/07/18 appt. 90 tablet 3  . XARELTO 20 MG TABS tablet Take 20 mg by mouth daily.    . carvedilol (COREG) 12.5 MG tablet Take 1 tablet (12.5 mg total) by mouth 2 (two) times daily. 60 tablet 6   No current facility-administered medications for this visit.     Allergies:   Patient has no known allergies.   Social History:  The patient  reports that he has never smoked. He has never used smokeless tobacco. He reports current alcohol use. He reports that he does not use drugs.   Family History:  The patient's family history includes Deep vein thrombosis in his mother; Diabetes in his father; Dilated cardiomyopathy in his brother; Hypertension in his mother.    ROS:  Please see the history of present illness.   Otherwise, review of systems is positive for none.   All other systems are reviewed and negative.    PHYSICAL EXAM: VS:  BP (!) 144/86   Pulse 65   Ht 5\' 10"  (1.778 m)   Wt 237 lb 12.8 oz (107.9 kg)   SpO2 99%   BMI 34.12 kg/m  , BMI Body mass index is 34.12 kg/m. GEN: Well nourished, well developed, in no acute distress  HEENT: normal  Neck: no JVD, carotid bruits, or masses Cardiac: RRR; no  murmurs, rubs, or gallops,no edema  Respiratory:  clear to auscultation bilaterally, normal work of breathing GI: soft, nontender, nondistended, + BS MS: no deformity or atrophy  Skin: warm and dry Neuro:  Strength and sensation are intact Psych: euthymic mood, full affect  EKG:  EKG is ordered today. Personal review of the ekg ordered shows sinus rhythm, first-degree AV block, right bundle branch block  Recent Labs: 11/15/2018: TSH 1.413 01/06/2019: BUN 15; Creatinine, Ser 1.32; Potassium 3.8; Sodium 137 01/17/2019: Hemoglobin 17.3; Platelets 213    Lipid Panel  No results found for: CHOL, TRIG, HDL, CHOLHDL, VLDL, LDLCALC, LDLDIRECT   Wt Readings from Last 3 Encounters:  02/20/19 237 lb 12.8 oz (107.9 kg)  01/25/19 232 lb (105.2 kg)  01/17/19 236 lb (107 kg)      Other studies Reviewed: Additional studies/ records that were reviewed today include: TTE 01/12/19  Review of the above records today demonstrates:   1. The left ventricle has normal systolic function with an ejection fraction of 60-65%. The cavity size was normal. There is severely increased left ventricular wall thickness. Left ventricular diastolic Doppler parameters are consistent with  pseudonormalization.  2. The right ventricle has normal systolic function. The cavity was normal.  3. Left atrial size was severely dilated.  4. The mitral valve is grossly normal.  5. The tricuspid valve is grossly normal.  6. The aortic valve is tricuspid. Mild thickening of the aortic valve. No stenosis of the aortic valve.  7. Normal LV systolic function; moderate diastolic dysfunction; severe LVH; severe LAE. Would consider cardiac MRI to R/O infiltrative cardiomyopathy or hypertrophic cardiomyopathy.   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  He is continued to have some palpitations.  It is unclear to me whether or not this is AVNRT as we had a successful ablation versus some other rhythm.  Blood  pressure is mildly elevated today.  We Angie Hogg thus switch him to carvedilol 12.5 mg.    Current medicines are reviewed at length with the patient today.   The patient does not have concerns regarding his medicines.  The following changes were made today: Stop metoprolol, start carvedilol  Labs/ tests ordered today include:  Orders Placed This Encounter  Procedures  . EKG 12-Lead     Disposition:   FU with Fadumo Heng 6 months  Signed, Deionte Spivack Meredith Leeds, MD  02/20/2019 10:24 AM     [facility] [address] [place] [address] [phone] (office) [phone] (fax)

## 2019-02-20 NOTE — Patient Instructions (Addendum)
Medication Instructions:  Your physician has recommended you make the following change in your medication:  1. STOP Metoprolol Succinate (Toprol) 2. START Carvedilol 12.5 mg twice daily  * If you need a refill on your cardiac medications before your next appointment, please call your pharmacy.   Labwork: None ordered  Testing/Procedures: None ordered  Follow-Up: Your physician wants you to follow-up in: 6 months with Dr. Curt Bears.  You will receive a reminder letter in the mail two months in advance. If you don't receive a letter, please call our office to schedule the follow-up appointment.  Thank you for choosing [facility]!!   Trinidad Curet, RN [phone]  Any Other Special Instructions Will Be Listed Below (If Applicable).  Carvedilol tablets What is this medicine? CARVEDILOL (KAR ve dil ol) is a beta-blocker. Beta-blockers reduce the workload on the heart and help it to beat more regularly. This medicine is used to treat high blood pressure and heart failure. This medicine may be used for other purposes; ask your health care provider or pharmacist if you have questions. COMMON BRAND NAME(S): Coreg What should I tell my health care provider before I take this medicine? They need to know if you have any of these conditions:  circulation problems  diabetes  history of heart attack or heart disease  liver disease  lung or breathing disease, like asthma or emphysema  pheochromocytoma  slow or irregular heartbeat  thyroid disease  an unusual or allergic reaction to carvedilol, other beta-blockers, medicines, foods, dyes, or preservatives  pregnant or trying to get pregnant  breast-feeding How should I use this medicine? Take this medicine by mouth with a glass of water. Follow the directions on the prescription label. It is best to take the tablets with food. Take your doses at regular intervals. Do not take your medicine more often than directed. Do not  stop taking except on the advice of your doctor or health care professional. Talk to your pediatrician regarding the use of this medicine in children. Special care may be needed. Overdosage: If you think you have taken too much of this medicine contact a poison control center or emergency room at once. NOTE: This medicine is only for you. Do not share this medicine with others. What if I miss a dose? If you miss a dose, take it as soon as you can. If it is almost time for your next dose, take only that dose. Do not take double or extra doses. What may interact with this medicine? This medicine may interact with the following medications:  certain medicines for blood pressure, heart disease, irregular heart beat  certain medicines for depression, like fluoxetine or paroxetine  certain medicines for diabetes, like glipizide or glyburide  cimetidine  clonidine  cyclosporine  digoxin  MAOIs like Carbex, Eldepryl, Marplan, Nardil, and Parnate  reserpine  rifampin This list may not describe all possible interactions. Give your health care provider a list of all the medicines, herbs, non-prescription drugs, or dietary supplements you use. Also tell them if you smoke, drink alcohol, or use illegal drugs. Some items may interact with your medicine. What should I watch for while using this medicine? Check your heart rate and blood pressure regularly while you are taking this medicine. Ask your doctor or health care professional what your heart rate and blood pressure should be, and when you should contact him or her. Do not stop taking this medicine suddenly. This could lead to serious heart-related effects. Contact your doctor or  health care professional if you have difficulty breathing while taking this drug. Check your weight daily. Ask your doctor or health care professional when you should notify him/her of any weight gain. You may get drowsy or dizzy. Do not drive, use machinery, or do  anything that requires mental alertness until you know how this medicine affects you. To reduce the risk of dizzy or fainting spells, do not sit or stand up quickly. Alcohol can make you more drowsy, and increase flushing and rapid heartbeats. Avoid alcoholic drinks. This medicine may increase blood sugar. Ask your healthcare provider if changes in diet or medicines are needed if you have diabetes. If you are going to have surgery, tell your doctor or health care professional that you are taking this medicine. What side effects may I notice from receiving this medicine? Side effects that you should report to your doctor or health care professional as soon as possible:  allergic reactions like skin rash, itching or hives, swelling of the face, lips, or tongue  breathing problems  dark urine  irregular heartbeat   signs and symptoms of high blood sugar such as being more thirsty or hungry or having to urinate more than normal. You may also feel very tired or have blurry vision.  swollen legs or ankles  vomiting  yellowing of the eyes or skin Side effects that usually do not require medical attention (report to your doctor or health care professional if they continue or are bothersome):  change in sex drive or performance  diarrhea  dry eyes (especially if wearing contact lenses)  dry, itching skin  headache  nausea  unusually tired This list may not describe all possible side effects. Call your doctor for medical advice about side effects. You may report side effects to FDA at 1-800-FDA-1088. Where should I keep my medicine? Keep out of the reach of children. Store at room temperature below 30 degrees C (86 degrees F). Protect from moisture. Keep container tightly closed. Throw away any unused medicine after the expiration date. NOTE: This sheet is a summary. It may not cover all possible information. If you have questions about this medicine, talk to your doctor, pharmacist,  or health care provider.  2020 Elsevier/Gold Standard (2018-05-03 09:13:57)

## 2019-02-28 NOTE — Progress Notes (Signed)
Virtual Visit via Video Note   This visit type was conducted due to national recommendations for restrictions regarding the COVID-19 Pandemic (e.g. social distancing) in an effort to limit this patient's exposure and mitigate transmission in our community.  Due to his co-morbid illnesses, this patient is at least at moderate risk for complications without adequate follow up.  This format is felt to be most appropriate for this patient at this time.  All issues noted in this document were discussed and addressed.  A limited physical exam was performed with this format.  Please refer to the patient's chart for his consent to telehealth for [facility].   Date:  03/01/2019   ID:  Dalton Ochoa, DOB Jun 22, 1961, MRN [id number]  Patient Location:Home Provider Location: Home  PCP:  [name], MD  Cardiologist:  Dr [name]  Evaluation Performed:  Follow-Up Visit  Chief Complaint:  FU SVT  History of Present Illness:    FU SVT. Patient presented April 22 with complaints of palpitations, weakness and chest heaviness. Electrocardiogram showed supraventricular tachycardia, right bundle branch block and prior septal infarct cannot be excluded.  Echocardiogram June 2020 showed normal LV function, severe left ventricular hypertrophy, severe left atrial enlargement.  There was concern about hypertrophic cardiomyopathy.  Nuclear study June 2020 showed ejection fraction 48% but no ischemia.  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  PYP scan July 2020 not suggestive of amyloid.  Patient seen by Dr. Curt Bears in follow-up July 2020.  He continued with palpitations and he was changed to carvedilol.  Also with history of elevated hemoglobin. Since last seen patient denies dyspnea, chest pain, palpitations or syncope.  The patient does not have symptoms concerning for COVID-19 infection (fever, chills, cough, or new shortness of breath).    Past Medical History:  Diagnosis Date  . Diabetes mellitus  without complication ([place])   . DVT (deep venous thrombosis) ([place])   . Gout   . Hyperlipidemia   . Hypertension   . Inguinal hernia   . Occasional tremors    Right hand  . Pulmonary emboli ([place]) 07/21/2005  . Raynaud's disease   . Varicose veins   . Venous stasis    Past Surgical History:  Procedure Laterality Date  . ENDOVENOUS ABLATION SAPHENOUS VEIN W/ LASER Right 10-10-2014   EVLA right greater saphenous vein by Curt Jews MD  . ENDOVENOUS ABLATION SAPHENOUS VEIN W/ LASER Left 12-05-2014   endovenous laser ablation left small saphenous vein by Curt Jews MD  . INGUINAL HERNIA REPAIR Right   . NESBIT PROCEDURE N/A 09/02/2017   Procedure: NESBIT PROCEDURE AND BLOCK;  Surgeon: [name], MD;  Location: [facility];  Service: Urology;  Laterality: N/A;  . SVT ABLATION N/A 01/17/2019   Procedure: SVT ABLATION;  Surgeon: Constance Haw, MD;  Location: [place] [facility];  Service: Cardiovascular;  Laterality: N/A;  . TESTICLE REMOVAL Right      Current Meds  Medication Sig  . allopurinol (ZYLOPRIM) 100 MG tablet Take 1 tablet by mouth daily.  . carvedilol (COREG) 12.5 MG tablet Take 1 tablet (12.5 mg total) by mouth 2 (two) times daily.  . furosemide (LASIX) 20 MG tablet Take 20 mg by mouth daily.  . hydroxychloroquine (PLAQUENIL) 200 MG tablet Take 1 tablet by mouth 2 (two) times daily.  . metFORMIN (GLUCOPHAGE) 500 MG tablet Take 500 mg by mouth daily.  . metoprolol tartrate (LOPRESSOR) 25 MG tablet Take 1.5 tablets (37.5 mg total) by mouth 2 (  two) times daily.  . pravastatin (PRAVACHOL) 20 MG tablet Take 20 mg by mouth daily.   . predniSONE (DELTASONE) 5 MG tablet Take 0.5 tablets by mouth daily.  . primidone (MYSOLINE) 50 MG tablet Take 1 tablet (50 mg total) by mouth at bedtime. Must be seen prior to future refills. Please keep pending 09/07/18 appt.  Alveda Reasons 20 MG TABS tablet Take 20 mg by mouth daily.     Allergies:   Patient has no known  allergies.   Social History   Tobacco Use  . Smoking status: Never Smoker  . Smokeless tobacco: Never Used  Substance Use Topics  . Alcohol use: Yes    Alcohol/week: 0.0 standard drinks    Comment: (2) 16 oz beers every other week  . Drug use: No     Family Hx: The patient's family history includes Deep vein thrombosis in his mother; Diabetes in his father; Dilated cardiomyopathy in his brother; Hypertension in his mother.  ROS:   Please see the history of present illness.    No Fever, chills  or productive cough All other systems reviewed and are negative.   Recent Labs: 11/15/2018: TSH 1.413 01/06/2019: BUN 15; Creatinine, Ser 1.32; Potassium 3.8; Sodium 137 01/17/2019: Hemoglobin 17.3; Platelets 213    Wt Readings from Last 3 Encounters:  03/01/19 236 lb (107 kg)  02/20/19 237 lb 12.8 oz (107.9 kg)  01/25/19 232 lb (105.2 kg)     Objective:    Vital Signs:  BP 138/84   Pulse 65   Ht 5\' 10"  (1.778 m)   Wt 236 lb (107 kg)   BMI 33.86 kg/m    VITAL SIGNS:  reviewed NAD Answers questions appropriately Normal affect Remainder of physical examination not performed (telehealth visit; coronavirus pandemic)  ASSESSMENT & PLAN:    1. Supraventricular tachycardia-patient is status post ablation of AVNRT.  No recurrent episodes. 2. Hypertension-patient's blood pressure is elevated.  Add benazepril 5 mg daily.  Check potassium and renal function in 1 week.  Follow blood pressure and adjust regimen as needed. 3. Chest pain-no ischemia on recent nuclear study.  No recurrent chest pain 4. Elevated hemoglobin-I have asked him to follow-up with primary care for this issue.  COVID-19 Education: The importance of social distancing was discussed today.  Time:   Today, I have spent 17 minutes with the patient with telehealth technology discussing the above problems.     Medication Adjustments/Labs and Tests Ordered: Current medicines are reviewed at length with the patient  today.  Concerns regarding medicines are outlined above.   Tests Ordered: No orders of the defined types were placed in this encounter.   Medication Changes: No orders of the defined types were placed in this encounter.   Follow Up:  Virtual Visit or In Person in 6 month(s)  Signed, Kirk Ruths, MD  03/01/2019 9:36 AM    [address]

## 2019-03-01 ENCOUNTER — Telehealth (INDEPENDENT_AMBULATORY_CARE_PROVIDER_SITE_OTHER): Payer: Managed Care, Other (non HMO) | Admitting: Cardiology

## 2019-03-01 ENCOUNTER — Other Ambulatory Visit: Payer: Self-pay

## 2019-03-01 VITALS — BP 138/84 | HR 65 | Ht 70.0 in | Wt 236.0 lb

## 2019-03-01 DIAGNOSIS — I10 Essential (primary) hypertension: Secondary | ICD-10-CM

## 2019-03-01 DIAGNOSIS — R072 Precordial pain: Secondary | ICD-10-CM

## 2019-03-01 DIAGNOSIS — I471 Supraventricular tachycardia: Secondary | ICD-10-CM | POA: Diagnosis not present

## 2019-03-01 MED ORDER — BENAZEPRIL HCL 5 MG PO TABS: 5.0000 mg | ORAL_TABLET | Freq: Every day | ORAL | Status: DC

## 2019-03-01 NOTE — Patient Instructions (Signed)
Medication Instructions:  RESTART BENAZEPRIL 5 MG ONCE DAILY  If you need a refill on your cardiac medications before your next appointment, please call your pharmacy.   Lab work: Your physician recommends that you return for lab work in: [place]  If you have labs (blood work) drawn today and your tests are completely normal, you will receive your results only by: [name] MyChart Message (if you have MyChart) OR . A paper copy in the mail If you have any lab test that is abnormal or we need to change your treatment, we will call you to review the results.  Follow-Up: At [facility], you and your health needs are our priority.  As part of our continuing mission to provide you with exceptional heart care, we have created designated Provider Care Teams.  These Care Teams include your primary Cardiologist (physician) and Advanced Practice Providers (APPs -  Physician Assistants and Nurse Practitioners) who all work together to provide you with the care you need, when you need it. You will need a follow up appointment in 6 months.  Please call our office 2 months in advance to schedule this appointment.  You may see Kirk Ruths MD or one of the following Advanced Practice Providers on your designated Care Team:   Kerin Ransom, PA-C Roby Lofts, [place] . Sande Rives, PA-C

## 2019-03-06 ENCOUNTER — Ambulatory Visit: Payer: Managed Care, Other (non HMO) | Admitting: Cardiology

## 2019-03-15 LAB — BASIC METABOLIC PANEL
BUN/Creatinine Ratio: 14 (ref 9–20)
BUN: 19 mg/dL (ref 6–24)
CO2: 21 mmol/L (ref 20–29)
Calcium: 9.2 mg/dL (ref 8.7–10.2)
Chloride: 97 mmol/L (ref 96–106)
Creatinine, Ser: 1.4 mg/dL — ABNORMAL HIGH (ref 0.76–1.27)
GFR calc Af Amer: 64 mL/min/{1.73_m2} (ref >59)
GFR calc non Af Amer: 56 mL/min/{1.73_m2} — ABNORMAL LOW (ref >59)
Glucose: 94 mg/dL (ref 65–99)
Potassium: 4.3 mmol/L (ref 3.5–5.2)
Sodium: 133 mmol/L — ABNORMAL LOW (ref 134–144)

## 2019-08-23 NOTE — Progress Notes (Signed)
HPI: FU SVT. Patient presented 4/20 with complaints of palpitations, weakness and chest heaviness. Electrocardiogram showed supraventricular tachycardia, right bundle branch block and prior septal infarct cannot be excluded.  Echocardiogram June 2020 showed normal LV function, severe left ventricular hypertrophy, severe left atrial enlargement.  There was concern about hypertrophic cardiomyopathy.  Nuclear study June 2020 showed ejection fraction 48% but no ischemia.  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  PYP scan July 2020 not suggestive of amyloid.  Patient seen by Dr. Curt Bears in follow-up July 2020.  He continued with palpitations and he was changed to carvedilol.  Also with history of elevated hemoglobin. Since last seen patient denies dyspnea, chest pain, palpitations or syncope.  Current Outpatient Medications  Medication Sig Dispense Refill  . allopurinol (ZYLOPRIM) 100 MG tablet Take 1 tablet by mouth daily.    . benazepril (LOTENSIN) 5 MG tablet Take 1 tablet (5 mg total) by mouth daily.    . carvedilol (COREG) 12.5 MG tablet Take 1 tablet (12.5 mg total) by mouth 2 (two) times daily. 60 tablet 6  . furosemide (LASIX) 20 MG tablet Take 20 mg by mouth daily.    . hydroxychloroquine (PLAQUENIL) 200 MG tablet Take 1 tablet by mouth 2 (two) times daily.    . metFORMIN (GLUCOPHAGE) 500 MG tablet Take 500 mg by mouth daily.    . pravastatin (PRAVACHOL) 20 MG tablet Take 20 mg by mouth daily.     . predniSONE (DELTASONE) 5 MG tablet Take 0.5 tablets by mouth daily.    . primidone (MYSOLINE) 50 MG tablet Take 1 tablet (50 mg total) by mouth at bedtime. Must be seen prior to future refills. Please keep pending 09/07/18 appt. 90 tablet 3  . XARELTO 20 MG TABS tablet Take 20 mg by mouth daily.     No current facility-administered medications for this visit.     Past Medical History:  Diagnosis Date  . Diabetes mellitus without complication ([place])   . DVT (deep venous thrombosis) ([place])    . Gout   . Hyperlipidemia   . Hypertension   . Inguinal hernia   . Occasional tremors    Right hand  . Pulmonary emboli ([place]) 07/21/2005  . Raynaud's disease   . Varicose veins   . Venous stasis     Past Surgical History:  Procedure Laterality Date  . ENDOVENOUS ABLATION SAPHENOUS VEIN W/ LASER Right 10-10-2014   EVLA right greater saphenous vein by Curt Jews MD  . ENDOVENOUS ABLATION SAPHENOUS VEIN W/ LASER Left 12-05-2014   endovenous laser ablation left small saphenous vein by Curt Jews MD  . INGUINAL HERNIA REPAIR Right   . NESBIT PROCEDURE N/A 09/02/2017   Procedure: NESBIT PROCEDURE AND BLOCK;  Surgeon: [name], MD;  Location: [facility];  Service: Urology;  Laterality: N/A;  . SVT ABLATION N/A 01/17/2019   Procedure: SVT ABLATION;  Surgeon: Constance Haw, MD;  Location: [place] [facility];  Service: Cardiovascular;  Laterality: N/A;  . TESTICLE REMOVAL Right     Social History   Socioeconomic History  . Marital status: Married    Spouse name: Lilyan Punt  . Number of children: 2  . Years of education: 67  . Highest education level: Not on file  Occupational History  . 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 Smoking status: Never Smoker  . Smokeless tobacco: Never Used  Substance and Sexual Activity  . Alcohol use: Yes    Alcohol/week: 0.0 standard  drinks    Comment: (2) 16 oz beers every other week  . Drug use: No  . Sexual activity: Not on file  Other Topics Concern  . Not on file  Social History Narrative   Lives with wife and two daughters   Caffeine use: 2 sodas per month   Right handed    Social Determinants of Health   Financial Resource Strain:   . Difficulty of Paying Living Expenses: Not on file  Food Insecurity:   . Worried About Charity fundraiser in the Last Year: Not on file  . Ran Out of Food in the Last Year: Not on file  Transportation Needs:   . Lack of Transportation (Medical): Not on file  . Lack of  Transportation (Non-Medical): Not on file  Physical Activity:   . Days of Exercise per Week: Not on file  . Minutes of Exercise per Session: Not on file  Stress:   . Feeling of Stress : Not on file  Social Connections:   . Frequency of Communication with Friends and Family: Not on file  . Frequency of Social Gatherings with Friends and Family: Not on file  . Attends Religious Services: Not on file  . Active Member of Clubs or Organizations: Not on file  . Attends Archivist Meetings: Not on file  . Marital Status: Not on file  Intimate Partner Violence:   . Fear of Current or Ex-Partner: Not on file  . Emotionally Abused: Not on file  . Physically Abused: Not on file  . Sexually Abused: Not on file    Family History  Problem Relation Age of Onset  . Deep vein thrombosis Mother   . Hypertension Mother   . Diabetes Father   . Dilated cardiomyopathy Brother     ROS: Some fatigue but no fevers or chills, productive cough, hemoptysis, dysphasia, odynophagia, melena, hematochezia, dysuria, hematuria, rash, seizure activity, orthopnea, PND, pedal edema, claudication. Remaining systems are negative.  Physical Exam: Well-developed well-nourished in no acute distress.  Skin is warm and dry.  HEENT is normal.  Neck is supple.  Chest is clear to auscultation with normal expansion.  Cardiovascular exam is regular rate and rhythm.  Abdominal exam nontender or distended. No masses palpated. Extremities show no edema. neuro grossly intact  A/P  1 supraventricular tachycardia-status post ablation of AVNRT.  Patient has had no recurrences.  2 hypertension-blood pressure elevated.  Increase benazepril to 10 mg daily.  Check potassium and renal function in 1 week.  Goal systolic blood pressure [id number] and diastolic blood pressure 85.  3 history of chest pain-no recurrent symptoms and previous nuclear study negative.  We will not pursue further ischemia evaluation.  4 history of  elevated hemoglobin-Pt followed up with primary care for this issue.  Kirk Ruths, MD

## 2019-08-29 ENCOUNTER — Other Ambulatory Visit: Payer: Self-pay

## 2019-08-29 ENCOUNTER — Ambulatory Visit: Payer: Managed Care, Other (non HMO) | Admitting: Cardiology

## 2019-08-29 ENCOUNTER — Encounter: Payer: Self-pay | Admitting: Cardiology

## 2019-08-29 VITALS — BP 146/80 | HR 67 | Ht 70.0 in | Wt 247.8 lb

## 2019-08-29 DIAGNOSIS — I471 Supraventricular tachycardia: Secondary | ICD-10-CM

## 2019-08-29 DIAGNOSIS — I10 Essential (primary) hypertension: Secondary | ICD-10-CM | POA: Diagnosis not present

## 2019-08-29 DIAGNOSIS — R072 Precordial pain: Secondary | ICD-10-CM | POA: Diagnosis not present

## 2019-08-29 MED ORDER — BENAZEPRIL HCL 10 MG PO TABS: 10.0000 mg | ORAL_TABLET | Freq: Every day | ORAL | 3 refills | Status: DC

## 2019-08-29 NOTE — Patient Instructions (Signed)
Medication Instructions:  INCREASE BENAZEPRIL TO 10 MG ONCE DAILY= 2 OF THE 5 MG TABLETS ONCE DAILY  *If you need a refill on your cardiac medications before your next appointment, please call your pharmacy*  Lab Work: Your physician recommends that you return for lab work in: [place]  If you have labs (blood work) drawn today and your tests are completely normal, you will receive your results only by: [name] MyChart Message (if you have MyChart) OR . A paper copy in the mail If you have any lab test that is abnormal or we need to change your treatment, we will call you to review the results.  Follow-Up: At [facility], you and your health needs are our priority.  As part of our continuing mission to provide you with exceptional heart care, we have created designated Provider Care Teams.  These Care Teams include your primary Cardiologist (physician) and Advanced Practice Providers (APPs -  Physician Assistants and Nurse Practitioners) who all work together to provide you with the care you need, when you need it.  Your next appointment:   6 month(s)  The format for your next appointment:   Either In Person or Virtual  Provider:   You may see Kirk Ruths MD or one of the following Advanced Practice Providers on your designated Care Team:    Kerin Ransom, PA-C  [place], [place]  Coletta Memos, [place]

## 2019-09-05 LAB — BASIC METABOLIC PANEL
BUN/Creatinine Ratio: 12 (ref 9–20)
BUN: 16 mg/dL (ref 6–24)
CO2: 20 mmol/L (ref 20–29)
Calcium: 8.9 mg/dL (ref 8.7–10.2)
Chloride: 99 mmol/L (ref 96–106)
Creatinine, Ser: 1.3 mg/dL — ABNORMAL HIGH (ref 0.76–1.27)
GFR calc Af Amer: 70 mL/min/{1.73_m2} (ref >59)
GFR calc non Af Amer: 61 mL/min/{1.73_m2} (ref >59)
Glucose: 96 mg/dL (ref 65–99)
Potassium: 4.2 mmol/L (ref 3.5–5.2)
Sodium: 133 mmol/L — ABNORMAL LOW (ref 134–144)

## 2019-09-10 ENCOUNTER — Ambulatory Visit: Payer: Managed Care, Other (non HMO) | Admitting: Neurology

## 2019-09-10 ENCOUNTER — Other Ambulatory Visit: Payer: Self-pay

## 2019-09-10 ENCOUNTER — Encounter: Payer: Self-pay | Admitting: Neurology

## 2019-09-10 VITALS — BP 140/82 | HR 56 | Temp 97.2°F | Ht 70.0 in | Wt 247.8 lb

## 2019-09-10 DIAGNOSIS — R251 Tremor, unspecified: Secondary | ICD-10-CM

## 2019-09-10 MED ORDER — PRIMIDONE 50 MG PO TABS: 50.0000 mg | ORAL_TABLET | Freq: Every day | ORAL | 3 refills | Status: DC

## 2019-09-10 NOTE — Progress Notes (Signed)
PATIENT: Dalton Ochoa DOB: 1961-02-19  REASON FOR VISIT: follow up HISTORY FROM: patient  HISTORY OF PRESENT ILLNESS: Today 09/10/19  Dalton Ochoa is a 59 year old male with history of tremor affecting his right hand.  The tremor worsens when he is upset.  He remains on Mysoline.  MRI of the brain has shown minimal white matter changes in the past.  His tremor is under good control, does not affect his handwriting or eating.  He works as a Glass blower/designer.  He is tolerating Mysoline without side effect.  Since last seen, says he had SVT, had cardiac ablation, has not had further problems.  He presents today for evaluation unaccompanied.   HISTORY  HISTORY OF PRESENT ILLNESS:UPDATE 2/13/2020CM Dalton Ochoa, 59 year old male returns for follow-up with a history of tremor mainly affecting his right hand.  The tremor gets significantly worse if he is upset.  The patient says he has problems with writing.  He has minimal problems with eating.  His tremor is much improved with Mysoline.  He denies any side effects to the drug.  He has no resting tremor.  He denies any vocal tremor.  MRI of the brain has showed minimal white matter changes in the past.  He returns for reevaluation  REVIEW OF SYSTEMS: Out of a complete 14 system review of symptoms, the patient complains only of the following symptoms, and all other reviewed systems are negative.  Tremor  ALLERGIES: No Known Allergies  HOME MEDICATIONS: Outpatient Medications Prior to Visit  Medication Sig Dispense Refill  . allopurinol (ZYLOPRIM) 100 MG tablet Take 1 tablet by mouth daily.    . benazepril (LOTENSIN) 10 MG tablet Take 1 tablet (10 mg total) by mouth daily. 90 tablet 3  . furosemide (LASIX) 20 MG tablet Take 20 mg by mouth daily.    . hydroxychloroquine (PLAQUENIL) 200 MG tablet Take 1 tablet by mouth 2 (two) times daily.    . metFORMIN (GLUCOPHAGE) 500 MG tablet Take 500 mg by mouth daily.    . pravastatin (PRAVACHOL) 20  MG tablet Take 20 mg by mouth daily.     . predniSONE (DELTASONE) 5 MG tablet Take 0.5 tablets by mouth daily.    Alveda Reasons 20 MG TABS tablet Take 20 mg by mouth daily.    . primidone (MYSOLINE) 50 MG tablet Take 1 tablet (50 mg total) by mouth at bedtime. Must be seen prior to future refills. Please keep pending 09/07/18 appt. 90 tablet 3  . carvedilol (COREG) 12.5 MG tablet Take 1 tablet (12.5 mg total) by mouth 2 (two) times daily. 60 tablet 6   No facility-administered medications prior to visit.    PAST MEDICAL HISTORY: Past Medical History:  Diagnosis Date  . Diabetes mellitus without complication ([place])   . DVT (deep venous thrombosis) ([place])   . Gout   . Hyperlipidemia   . Hypertension   . Inguinal hernia   . Occasional tremors    Right hand  . Pulmonary emboli ([place]) 07/21/2005  . Raynaud's disease   . Varicose veins   . Venous stasis     PAST SURGICAL HISTORY: Past Surgical History:  Procedure Laterality Date  . ENDOVENOUS ABLATION SAPHENOUS VEIN W/ LASER Right 10-10-2014   EVLA right greater saphenous vein by Curt Jews MD  . ENDOVENOUS ABLATION SAPHENOUS VEIN W/ LASER Left 12-05-2014   endovenous laser ablation left small saphenous vein by Curt Jews MD  . INGUINAL HERNIA REPAIR Right   . NESBIT PROCEDURE  N/A 09/02/2017   Procedure: NESBIT PROCEDURE AND BLOCK;  Surgeon: [name], MD;  Location: [facility];  Service: Urology;  Laterality: N/A;  . SVT ABLATION N/A 01/17/2019   Procedure: SVT ABLATION;  Surgeon: Constance Haw, MD;  Location: [place] [facility];  Service: Cardiovascular;  Laterality: N/A;  . TESTICLE REMOVAL Right     FAMILY HISTORY: Family History  Problem Relation Age of Onset  . Deep vein thrombosis Mother   . Hypertension Mother   . Diabetes Father   . Dilated cardiomyopathy Brother     SOCIAL HISTORY: Social History   Socioeconomic History  . Marital status: Married    Spouse name: Lilyan Punt  . Number of children:  2  . Years of education: 62  . Highest education level: Not on file  Occupational History  . 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 Smoking status: Never Smoker  . Smokeless tobacco: Never Used  Substance and Sexual Activity  . Alcohol use: Yes    Alcohol/week: 0.0 standard drinks    Comment: (2) 16 oz beers every other week  . Drug use: No  . Sexual activity: Not on file  Other Topics Concern  . Not on file  Social History Narrative   Lives with wife and two daughters   Caffeine use: 2 sodas per month   Right handed    Social Determinants of Health   Financial Resource Strain:   . Difficulty of Paying Living Expenses: Not on file  Food Insecurity:   . Worried About Charity fundraiser in the Last Year: Not on file  . Ran Out of Food in the Last Year: Not on file  Transportation Needs:   . Lack of Transportation (Medical): Not on file  . Lack of Transportation (Non-Medical): Not on file  Physical Activity:   . Days of Exercise per Week: Not on file  . Minutes of Exercise per Session: Not on file  Stress:   . Feeling of Stress : Not on file  Social Connections:   . Frequency of Communication with Friends and Family: Not on file  . Frequency of Social Gatherings with Friends and Family: Not on file  . Attends Religious Services: Not on file  . Active Member of Clubs or Organizations: Not on file  . Attends Archivist Meetings: Not on file  . Marital Status: Not on file  Intimate Partner Violence:   . Fear of Current or Ex-Partner: Not on file  . Emotionally Abused: Not on file  . Physically Abused: Not on file  . Sexually Abused: Not on file   PHYSICAL EXAM  Vitals:   09/10/19 1017  BP: 140/82  Pulse: (!) 56  Temp: (!) 97.2 F (36.2 C)  Weight: 247 lb 12.8 oz (112.4 kg)  Height: 5\' 10"  (1.778 m)   Body mass index is 35.56 kg/m.  Generalized: Well developed, in no acute distress   Neurological examination  Mentation: Alert oriented to time,  place, history taking. Follows all commands speech and language fluent Cranial nerve II-XII: Pupils were equal round reactive to light. Extraocular movements were full, visual field were full on confrontational test. Facial sensation and strength were normal. Head turning and shoulder shrug were normal and symmetric. Motor: The motor testing reveals 5 over 5 strength of all 4 extremities. Good symmetric motor tone is noted throughout.  No postural tremor was noted. Sensory: Sensory testing is intact to soft touch on all 4 extremities. No evidence  of extinction is noted.  Coordination: Cerebellar testing reveals good finger-nose-finger and heel-to-shin bilaterally.  Gait and station: Gait is normal. Tandem gait is normal. Romberg is negative. No drift is seen.  Reflexes: Deep tendon reflexes are symmetric and normal bilaterally.   DIAGNOSTIC DATA (LABS, IMAGING, TESTING) - I reviewed patient records, labs, notes, testing and imaging myself where available.  Lab Results  Component Value Date   WBC 5.8 01/17/2019   HGB 17.3 (H) 01/17/2019   HCT 51.1 01/17/2019   MCV 97.1 01/17/2019   PLT 213 01/17/2019      Component Value Date/Time   NA 133 (L) 09/05/2019 0839   K 4.2 09/05/2019 0839   CL 99 09/05/2019 0839   CO2 20 09/05/2019 0839   GLUCOSE 96 09/05/2019 0839   GLUCOSE 124 (H) 01/06/2019 1310   BUN 16 09/05/2019 0839   CREATININE 1.30 (H) 09/05/2019 0839   CALCIUM 8.9 09/05/2019 0839   PROT 7.8 09/09/2016 1003   ALBUMIN 4.6 09/09/2016 1003   AST 29 09/09/2016 1003   ALT 23 09/09/2016 1003   ALKPHOS 84 09/09/2016 1003   BILITOT 0.7 09/09/2016 1003   GFRNONAA 61 09/05/2019 0839   GFRAA 70 09/05/2019 0839   No results found for: CHOL, HDL, LDLCALC, LDLDIRECT, TRIG, CHOLHDL No results found for: HGBA1C No results found for: VITAMINB12 Lab Results  Component Value Date   TSH 1.413 11/15/2018    ASSESSMENT AND PLAN 59 y.o. year old male  has a past medical history of Diabetes  mellitus without complication ([place]), DVT (deep venous thrombosis) ([place]), Gout, Hyperlipidemia, Hypertension, Inguinal hernia, Occasional tremors, Pulmonary emboli ([place]) (07/21/2005), Raynaud's disease, Varicose veins, and Venous stasis. here with:  1.  Mild essential tremor, right upper extremity  His tremor is well controlled on Mysoline.  He will remain on Mysoline 50 mg daily.  I will refill the medication.  The tremor does not impact his daily life or occupation.  He will follow-up in 1 year or sooner if needed.   I spent 15 minutes with the patient. 50% of this time was spent discussing his plan of care.   Butler Denmark, AGNP-C, DNP 09/10/2019, 10:40 AM [facility] [address] [place], [place] [address] [phone]

## 2019-09-10 NOTE — Progress Notes (Signed)
I have read the note, and I agree with the clinical assessment and plan.  Isador Castille K Evellyn Tuff   

## 2019-09-26 ENCOUNTER — Other Ambulatory Visit: Payer: Self-pay | Admitting: Cardiology

## 2019-09-30 ENCOUNTER — Ambulatory Visit: Payer: Managed Care, Other (non HMO) | Attending: Internal Medicine

## 2019-09-30 DIAGNOSIS — Z23 Encounter for immunization: Secondary | ICD-10-CM | POA: Insufficient documentation

## 2019-09-30 NOTE — Progress Notes (Signed)
   Covid-19 Vaccination Clinic  Name:  Dalton Ochoa    MRN: [id number] DOB: 1961-01-24  09/30/2019  [name] was observed post Covid-19 immunization for 15 minutes without incident. He was provided with Vaccine Information Sheet and instruction to access the V-Safe system.   [name] was instructed to call 911 with any severe reactions post vaccine: [name] Difficulty breathing  . Swelling of face and throat  . A fast heartbeat  . A bad rash all over body  . Dizziness and weakness   Immunizations Administered    Name Date Dose VIS Date Route   Pfizer COVID-19 Vaccine 09/30/2019  6:26 PM 0.3 mL 07/06/2019 Intramuscular   Manufacturer: [place]   Lot: [id number]   [place]: [id number]

## 2019-10-12 ENCOUNTER — Other Ambulatory Visit: Payer: Self-pay

## 2019-10-12 MED ORDER — CARVEDILOL 12.5 MG PO TABS: ORAL_TABLET | ORAL | 10 refills | Status: DC

## 2019-10-26 ENCOUNTER — Other Ambulatory Visit: Payer: Self-pay

## 2019-10-26 ENCOUNTER — Ambulatory Visit (INDEPENDENT_AMBULATORY_CARE_PROVIDER_SITE_OTHER): Payer: Managed Care, Other (non HMO) | Admitting: Cardiology

## 2019-10-26 ENCOUNTER — Encounter: Payer: Self-pay | Admitting: Cardiology

## 2019-10-26 VITALS — BP 150/94 | HR 69 | Ht 70.0 in | Wt 247.0 lb

## 2019-10-26 DIAGNOSIS — I471 Supraventricular tachycardia: Secondary | ICD-10-CM

## 2019-10-26 NOTE — Progress Notes (Signed)
Electrophysiology Office Note   Date:  10/26/2019   ID:  Dalton Ochoa, DOB 23-Jun-1961, MRN [id number]  PCP:  [name], MD  Cardiologist: [name] Primary Electrophysiologist:  [name], MD    No chief complaint on file.    History of Present Illness: Dalton Ochoa is a 59 y.o. male who is being seen today for the evaluation of SVT at the request of [name], MD. Presenting today for electrophysiology evaluation.  He has a history of SVT.  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  His slow pathway was ablated.  Today, denies symptoms of palpitations, chest pain, shortness of breath, orthopnea, PND, lower extremity edema, claudication, dizziness, presyncope, syncope, bleeding, or neurologic sequela. The patient is tolerating medications without difficulties.  Overall he has done well.  He has had no further palpitations.  He has no chest pain or shortness of breath.  From a cardiac standpoint he has no complaints.  Unfortunately, his mother was recently diagnosed with pancreatic cancer and he has been driving back and forth to [place].  Past Medical History:  Diagnosis Date  . Diabetes mellitus without complication ([place])   . DVT (deep venous thrombosis) ([place])   . Gout   . Hyperlipidemia   . Hypertension   . Inguinal hernia   . Occasional tremors    Right hand  . Pulmonary emboli ([place]) 07/21/2005  . Raynaud's disease   . Varicose veins   . Venous stasis    Past Surgical History:  Procedure Laterality Date  . ENDOVENOUS ABLATION SAPHENOUS VEIN W/ LASER Right 10-10-2014   EVLA right greater saphenous vein by Curt Jews MD  . ENDOVENOUS ABLATION SAPHENOUS VEIN W/ LASER Left 12-05-2014   endovenous laser ablation left small saphenous vein by Curt Jews MD  . INGUINAL HERNIA REPAIR Right   . NESBIT PROCEDURE N/A 09/02/2017   Procedure: NESBIT PROCEDURE AND BLOCK;  Surgeon: [name], MD;   Location: [facility];  Service: Urology;  Laterality: N/A;  . SVT ABLATION N/A 01/17/2019   Procedure: SVT ABLATION;  Surgeon: Constance Haw, MD;  Location: [place] [facility];  Service: Cardiovascular;  Laterality: N/A;  . TESTICLE REMOVAL Right      Current Outpatient Medications  Medication Sig Dispense Refill  . allopurinol (ZYLOPRIM) 100 MG tablet Take 1 tablet by mouth daily.    [name] amoxicillin (AMOXIL) 500 MG tablet Take 500 mg by mouth 3 (three) times daily.    . benazepril (LOTENSIN) 20 MG tablet Take 20 mg by mouth daily.    . carvedilol (COREG) 12.5 MG tablet TAKE 1 TABLET(12.5 MG) BY MOUTH TWICE DAILY 60 tablet 10  . furosemide (LASIX) 20 MG tablet Take 20 mg by mouth daily.    . hydroxychloroquine (PLAQUENIL) 200 MG tablet Take 1 tablet by mouth 2 (two) times daily.    . metFORMIN (GLUCOPHAGE) 500 MG tablet Take 500 mg by mouth daily.    . pravastatin (PRAVACHOL) 20 MG tablet Take 20 mg by mouth daily.     . primidone (MYSOLINE) 50 MG tablet Take 1 tablet (50 mg total) by mouth at bedtime. Must be seen prior to future refills. 90 tablet 3  . XARELTO 20 MG TABS tablet Take 20 mg by mouth daily.     No current facility-administered medications for this visit.    Allergies:   Patient has no known allergies.   Social History:  The  patient  reports that he has never smoked. He has never used smokeless tobacco. He reports current alcohol use. He reports that he does not use drugs.   Family History:  The patient's family history includes Deep vein thrombosis in his mother; Diabetes in his father; Dilated cardiomyopathy in his brother; Hypertension in his mother.    ROS:  Please see the history of present illness.   Otherwise, review of systems is positive for none.   All other systems are reviewed and negative.   PHYSICAL EXAM: VS:  BP (!) 150/94   Pulse 69   Ht 5\' 10"  (1.778 m)   Wt 247 lb (112 kg)   BMI 35.44 kg/m  , BMI Body mass index is 35.44  kg/m. GEN: Well nourished, well developed, in no acute distress  HEENT: normal  Neck: no JVD, carotid bruits, or masses Cardiac: RRR; no murmurs, rubs, or gallops,no edema  Respiratory:  clear to auscultation bilaterally, normal work of breathing GI: soft, nontender, nondistended, + BS MS: no deformity or atrophy  Skin: warm and dry Neuro:  Strength and sensation are intact Psych: euthymic mood, full affect  EKG:  EKG is ordered today. Personal review of the ekg ordered shows sinus rhythm, right bundle branch block, rate 69  Recent Labs: 11/15/2018: TSH 1.413 01/17/2019: Hemoglobin 17.3; Platelets 213 09/05/2019: BUN 16; Creatinine, Ser 1.30; Potassium 4.2; Sodium 133    Lipid Panel  No results found for: CHOL, TRIG, HDL, CHOLHDL, VLDL, LDLCALC, LDLDIRECT   Wt Readings from Last 3 Encounters:  10/26/19 247 lb (112 kg)  09/10/19 247 lb 12.8 oz (112.4 kg)  08/29/19 247 lb 12.8 oz (112.4 kg)      Other studies Reviewed: Additional studies/ records that were reviewed today include: TTE 01/12/19  Review of the above records today demonstrates:   1. The left ventricle has normal systolic function with an ejection fraction of 60-65%. The cavity size was normal. There is severely increased left ventricular wall thickness. Left ventricular diastolic Doppler parameters are consistent with  pseudonormalization.  2. The right ventricle has normal systolic function. The cavity was normal.  3. Left atrial size was severely dilated.  4. The mitral valve is grossly normal.  5. The tricuspid valve is grossly normal.  6. The aortic valve is tricuspid. Mild thickening of the aortic valve. No stenosis of the aortic valve.  7. Normal LV systolic function; moderate diastolic dysfunction; severe LVH; severe LAE. Would consider cardiac MRI to R/O infiltrative cardiomyopathy or hypertrophic cardiomyopathy.   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  He continues to be on  metoprolol, though would potentially like to hold the dose.  I told him that if he wishes to do this and has further palpitations to call us back.  2.  Hypertension: Blood pressure is elevated today.  He had a recent increase in his dose of benazepril.  Plan per primary cardiology and primary care.  Current medicines are reviewed at length with the patient today.   The patient does not have concerns regarding his medicines.  The following changes were made today: Stop metoprolol, start carvedilol  Labs/ tests ordered today include:  Orders Placed This Encounter  Procedures  . EKG 12-Lead     Disposition:   FU with Aundreya Souffrant as needed months  Signed, Prudy Candy Meredith Leeds, MD  10/26/2019 9:40 AM     [facility] [address] [place] [address] [phone] (office) [phone] (fax)

## 2019-10-31 ENCOUNTER — Ambulatory Visit: Payer: Managed Care, Other (non HMO) | Attending: Internal Medicine

## 2019-10-31 DIAGNOSIS — Z23 Encounter for immunization: Secondary | ICD-10-CM

## 2019-10-31 NOTE — Progress Notes (Signed)
   Covid-19 Vaccination Clinic  Name:  Dalton Ochoa    MRN: [id number] DOB: 03-27-1961  10/31/2019  Dalton Ochoa was observed post Covid-19 immunization for 15 minutes without incident. He was provided with Vaccine Information Sheet and instruction to access the V-Safe system.   Dalton Ochoa was instructed to call 911 with any severe reactions post vaccine: [name] Difficulty breathing  . Swelling of face and throat  . A fast heartbeat  . A bad rash all over body  . Dizziness and weakness   Immunizations Administered    Name Date Dose VIS Date Route   Pfizer COVID-19 Vaccine 10/31/2019  9:47 AM 0.3 mL 07/06/2019 Intramuscular   Manufacturer: [place]   Lot: [id number]   [place]: [id number]

## 2020-03-25 ENCOUNTER — Ambulatory Visit: Payer: Managed Care, Other (non HMO) | Attending: Critical Care Medicine

## 2020-03-25 DIAGNOSIS — Z23 Encounter for immunization: Secondary | ICD-10-CM

## 2020-03-25 NOTE — Progress Notes (Signed)
   Covid-19 Vaccination Clinic  Name:  Dalton Ochoa    MRN: [id number] DOB: [birth year]  03/25/2020  Mr. Mcphee was observed post Covid-19 immunization for 15 minutes without incident. He was provided with Vaccine Information Sheet and instruction to access the V-Safe system.   Mr. Strout was instructed to call 911 with any severe reactions post vaccine: [name] Difficulty breathing  . Swelling of face and throat  . A fast heartbeat  . A bad rash all over body  . Dizziness and weakness

## 2020-05-13 NOTE — Progress Notes (Signed)
HPI: FU SVT. Patient presented 4/20 with complaints of palpitations, weakness and chest heaviness. Electrocardiogram showed supraventricular tachycardia, right bundle branch block and prior septal infarct cannot be excluded.Echocardiogram June 2020 showed normal LV function, severe left ventricular hypertrophy, severe left atrial enlargement. There was concern about hypertrophic cardiomyopathy. Nuclear study June 2020 showed ejection fraction 48% but no ischemia. 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 PYP scan July 2020 not suggestive of amyloid. Patient seen by Dr. Curt Bears in follow-up July 2020. He continued with palpitations and he was changed to carvedilol. Also with history of elevated hemoglobin. Since last seen  there is no dyspnea on exertion, orthopnea, PND, chest pain or syncope.  He does have occasional palpitations described as his heart racing that are sudden in onset.  He drinks water and they resolve promptly.  Current Outpatient Medications  Medication Sig Dispense Refill  . allopurinol (ZYLOPRIM) 100 MG tablet Take 1 tablet by mouth daily.    [name] amoxicillin (AMOXIL) 500 MG tablet Take 500 mg by mouth 3 (three) times daily.    . benazepril (LOTENSIN) 20 MG tablet Take 20 mg by mouth daily.    . carvedilol (COREG) 12.5 MG tablet TAKE 1 TABLET(12.5 MG) BY MOUTH TWICE DAILY 60 tablet 10  . furosemide (LASIX) 20 MG tablet Take 20 mg by mouth daily.    . hydroxychloroquine (PLAQUENIL) 200 MG tablet Take 1 tablet by mouth 2 (two) times daily.    . metFORMIN (GLUCOPHAGE) 500 MG tablet Take 500 mg by mouth daily.    . metoprolol succinate (TOPROL-XL) 50 MG 24 hr tablet Take 50 mg by mouth daily. Take with or immediately following a meal.    . pravastatin (PRAVACHOL) 20 MG tablet Take 20 mg by mouth daily.     . primidone (MYSOLINE) 50 MG tablet Take 1 tablet (50 mg total) by mouth at bedtime. Must be seen prior to future refills. 90 tablet 3  . XARELTO 20 MG TABS tablet Take 20  mg by mouth daily.     No current facility-administered medications for this visit.     Past Medical History:  Diagnosis Date  . Diabetes mellitus without complication ([place])   . DVT (deep venous thrombosis) ([place])   . Gout   . Hyperlipidemia   . Hypertension   . Inguinal hernia   . Occasional tremors    Right hand  . Pulmonary emboli ([place]) 07/21/2005  . Raynaud's disease   . Varicose veins   . Venous stasis     Past Surgical History:  Procedure Laterality Date  . ENDOVENOUS ABLATION SAPHENOUS VEIN W/ LASER Right 10-10-2014   EVLA right greater saphenous vein by Curt Jews MD  . ENDOVENOUS ABLATION SAPHENOUS VEIN W/ LASER Left 12-05-2014   endovenous laser ablation left small saphenous vein by Curt Jews MD  . INGUINAL HERNIA REPAIR Right   . NESBIT PROCEDURE N/A 09/02/2017   Procedure: NESBIT PROCEDURE AND BLOCK;  Surgeon: [name], MD;  Location: [facility];  Service: Urology;  Laterality: N/A;  . SVT ABLATION N/A 01/17/2019   Procedure: SVT ABLATION;  Surgeon: Constance Haw, MD;  Location: [place] [facility];  Service: Cardiovascular;  Laterality: N/A;  . TESTICLE REMOVAL Right     Social History   Socioeconomic History  . Marital status: Married    Spouse name: Lilyan Punt  . Number of children: 2  . Years of education: 45  . Highest education level: Not on file  Occupational History  .  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 Smoking status: Never Smoker  . Smokeless tobacco: Never Used  Vaping Use  . Vaping Use: Never used  Substance and Sexual Activity  . Alcohol use: Yes    Alcohol/week: 0.0 standard drinks    Comment: (2) 16 oz beers every other week  . Drug use: No  . Sexual activity: Not on file  Other Topics Concern  . Not on file  Social History Narrative   Lives with wife and two daughters   Caffeine use: 2 sodas per month   Right handed    Social Determinants of Health   Financial Resource Strain:   . Difficulty of  Paying Living Expenses: Not on file  Food Insecurity:   . Worried About Charity fundraiser in the Last Year: Not on file  . Ran Out of Food in the Last Year: Not on file  Transportation Needs:   . Lack of Transportation (Medical): Not on file  . Lack of Transportation (Non-Medical): Not on file  Physical Activity:   . Days of Exercise per Week: Not on file  . Minutes of Exercise per Session: Not on file  Stress:   . Feeling of Stress : Not on file  Social Connections:   . Frequency of Communication with Friends and Family: Not on file  . Frequency of Social Gatherings with Friends and Family: Not on file  . Attends Religious Services: Not on file  . Active Member of Clubs or Organizations: Not on file  . Attends Archivist Meetings: Not on file  . Marital Status: Not on file  Intimate Partner Violence:   . Fear of Current or Ex-Partner: Not on file  . Emotionally Abused: Not on file  . Physically Abused: Not on file  . Sexually Abused: Not on file    Family History  Problem Relation Age of Onset  . Deep vein thrombosis Mother   . Hypertension Mother   . Diabetes Father   . Dilated cardiomyopathy Brother     ROS: no fevers or chills, productive cough, hemoptysis, dysphasia, odynophagia, melena, hematochezia, dysuria, hematuria, rash, seizure activity, orthopnea, PND, pedal edema, claudication. Remaining systems are negative.  Physical Exam: Well-developed well-nourished in no acute distress.  Skin is warm and dry.  HEENT is normal.  Neck is supple.  Chest is clear to auscultation with normal expansion.  Cardiovascular exam is regular rate and rhythm.  Abdominal exam nontender or distended. No masses palpated. Extremities show no edema. 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    2 hypertension-patient's blood pressure is elevated; discontinue Toprol and instead treat with carvedilol 12.5 mg twice  daily.  Follow blood pressure and increase medications as needed.  3 history of palpitations-patient describes intermittent palpitations that sound potentially to be recurrent SVT.  We will arrange a 30-day monitor to further assess.  4 history of chest pain-no recurrences and previous nuclear study showed no ischemia.  We can consider further evaluation in the future if needed.  5 history of elevated hemoglobin-patient previously instructed to follow-up with primary care concerning this issue.  6 history of pulmonary embolus-continue Xarelto per primary care.  Kirk Ruths, MD

## 2020-05-19 ENCOUNTER — Encounter: Payer: Self-pay | Admitting: Cardiology

## 2020-05-19 ENCOUNTER — Ambulatory Visit (INDEPENDENT_AMBULATORY_CARE_PROVIDER_SITE_OTHER): Payer: Managed Care, Other (non HMO) | Admitting: Cardiology

## 2020-05-19 ENCOUNTER — Other Ambulatory Visit: Payer: Self-pay

## 2020-05-19 VITALS — BP 156/90 | HR 82 | Ht 70.0 in | Wt 256.8 lb

## 2020-05-19 DIAGNOSIS — I10 Essential (primary) hypertension: Secondary | ICD-10-CM

## 2020-05-19 DIAGNOSIS — R002 Palpitations: Secondary | ICD-10-CM

## 2020-05-19 DIAGNOSIS — I471 Supraventricular tachycardia: Secondary | ICD-10-CM

## 2020-05-19 MED ORDER — CARVEDILOL 12.5 MG PO TABS: ORAL_TABLET | ORAL | 3 refills | Status: DC

## 2020-05-19 NOTE — Patient Instructions (Signed)
Medication Instructions:   STOP METOPROLOL  START CARVEDILOL 12.5 MG TWICE DAILY  *If you need a refill on your cardiac medications before your next appointment, please call your pharmacy*   Testing/Procedures:  Your physician has recommended that you wear a 30 DAY event monitor. Event monitors are medical devices that record the heart's electrical activity. Doctors most often [place] these monitors to diagnose arrhythmias. Arrhythmias are problems with the speed or rhythm of the heartbeat. The monitor is a small, portable device. You can wear one while you do your normal daily activities. This is usually used to diagnose what is causing palpitations/syncope (passing out).   Follow-Up: At [facility], you and your health needs are our priority.  As part of our continuing mission to provide you with exceptional heart care, we have created designated Provider Care Teams.  These Care Teams include your primary Cardiologist (physician) and Advanced Practice Providers (APPs -  Physician Assistants and Nurse Practitioners) who all work together to provide you with the care you need, when you need it.  We recommend signing up for the patient portal called "MyChart".  Sign up information is provided on this After Visit Summary.  MyChart is used to connect with patients for Virtual Visits (Telemedicine).  Patients are able to view lab/test results, encounter notes, upcoming appointments, etc.  Non-urgent messages can be sent to your provider as well.   To learn more about what you can do with MyChart, go to [email or web address].    Your next appointment:   3 month(s)  The format for your next appointment:   In Person  Provider:   Kirk Ruths, MD

## 2020-05-20 ENCOUNTER — Other Ambulatory Visit: Payer: Self-pay

## 2020-06-05 ENCOUNTER — Other Ambulatory Visit: Payer: Self-pay

## 2020-06-05 MED ORDER — CARVEDILOL 12.5 MG PO TABS
ORAL_TABLET | ORAL | 3 refills | Status: DC
Start: 2020-06-05 — End: 2020-09-09

## 2020-08-14 ENCOUNTER — Ambulatory Visit: Payer: Managed Care, Other (non HMO)

## 2020-08-19 NOTE — Progress Notes (Deleted)
HPI: FU SVT. Patient presented4/20with complaints of palpitations, weakness and chest heaviness. Electrocardiogram showed supraventricular tachycardia, right bundle branch block and prior septal infarct cannot be excluded.Echocardiogram June 2020 showed normal LV function, severe left ventricular hypertrophy, severe left atrial enlargement. There was concern about hypertrophic cardiomyopathy. Nuclear study June 2020 showed ejection fraction 48% but no ischemia. 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 PYP scan July 2020 not suggestive of amyloid. Patient seen by Dr. Curt Bears in follow-up July 2020. He continued with palpitations and he was changed to carvedilol. Also with history of elevated hemoglobin. Since last seen  Current Outpatient Medications  Medication Sig Dispense Refill  . allopurinol (ZYLOPRIM) 100 MG tablet Take 1 tablet by mouth daily.    [name] amoxicillin (AMOXIL) 500 MG tablet Take 500 mg by mouth 3 (three) times daily.    . benazepril (LOTENSIN) 20 MG tablet Take 20 mg by mouth daily.    . carvedilol (COREG) 12.5 MG tablet TAKE 1 TABLET(12.5 MG) BY MOUTH TWICE DAILY 180 tablet 3  . furosemide (LASIX) 20 MG tablet Take 20 mg by mouth daily.    . hydroxychloroquine (PLAQUENIL) 200 MG tablet Take 1 tablet by mouth 2 (two) times daily.    . metFORMIN (GLUCOPHAGE) 500 MG tablet Take 500 mg by mouth daily.    . pravastatin (PRAVACHOL) 20 MG tablet Take 20 mg by mouth daily.     . primidone (MYSOLINE) 50 MG tablet Take 1 tablet (50 mg total) by mouth at bedtime. Must be seen prior to future refills. 90 tablet 3  . XARELTO 20 MG TABS tablet Take 20 mg by mouth daily.     No current facility-administered medications for this visit.     Past Medical History:  Diagnosis Date  . Diabetes mellitus without complication ([place])   . DVT (deep venous thrombosis) ([place])   . Gout   . Hyperlipidemia   . Hypertension   . Inguinal hernia   . Occasional tremors    Right hand  .  Pulmonary emboli ([place]) 07/21/2005  . Raynaud's disease   . Varicose veins   . Venous stasis     Past Surgical History:  Procedure Laterality Date  . ENDOVENOUS ABLATION SAPHENOUS VEIN W/ LASER Right 10-10-2014   EVLA right greater saphenous vein by Curt Jews MD  . ENDOVENOUS ABLATION SAPHENOUS VEIN W/ LASER Left 12-05-2014   endovenous laser ablation left small saphenous vein by Curt Jews MD  . INGUINAL HERNIA REPAIR Right   . NESBIT PROCEDURE N/A 09/02/2017   Procedure: NESBIT PROCEDURE AND BLOCK;  Surgeon: [name], MD;  Location: [facility];  Service: Urology;  Laterality: N/A;  . SVT ABLATION N/A 01/17/2019   Procedure: SVT ABLATION;  Surgeon: Constance Haw, MD;  Location: [facility];  Service: Cardiovascular;  Laterality: N/A;  . TESTICLE REMOVAL Right     Social History   Socioeconomic History  . Marital status: Married    Spouse name: Lilyan Punt  . Number of children: 2  . Years of education: 59  . Highest education level: Not on file  Occupational History  . 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 Smoking status: Never Smoker  . Smokeless tobacco: Never Used  Vaping Use  . Vaping Use: Never used  Substance and Sexual Activity  . Alcohol use: Yes    Alcohol/week: 0.0 standard drinks    Comment: (2) 16 oz beers every other week  . Drug use: No  . Sexual  activity: Not on file  Other Topics Concern  . Not on file  Social History Narrative   Lives with wife and two daughters   Caffeine use: 2 sodas per month   Right handed    Social Determinants of Health   Financial Resource Strain: Not on file  Food Insecurity: Not on file  Transportation Needs: Not on file  Physical Activity: Not on file  Stress: Not on file  Social Connections: Not on file  Intimate Partner Violence: Not on file    Family History  Problem Relation Age of Onset  . Deep vein thrombosis Mother   . Hypertension Mother   . Diabetes Father   . Dilated  cardiomyopathy Brother     ROS: no fevers or chills, productive cough, hemoptysis, dysphasia, odynophagia, melena, hematochezia, dysuria, hematuria, rash, seizure activity, orthopnea, PND, pedal edema, claudication. Remaining systems are negative.  Physical Exam: Well-developed well-nourished in no acute distress.  Skin is warm and dry.  HEENT is normal.  Neck is supple.  Chest is clear to auscultation with normal expansion.  Cardiovascular exam is regular rate and rhythm.  Abdominal exam nontender or distended. No masses palpated. Extremities show no edema. neuro grossly intact  ECG- personally reviewed  A/P  1 supraventricular tachycardia-patient has had previous ablation of AVNRT.  2 hypertension-patient's blood pressure is controlled.  Continue present medications and follow.  3 history of palpitations-reasonly well controlled.  We ordered another monitor at last office visit but not performed.  We can reorder in the future if needed.  Continue beta-blocker.  4 history of chest pain-no recurrences.  Previous nuclear study unremarkable.  5 previous pulmonary embolus-patient will continue Xarelto managed by primary care.  6 history of elevated hemoglobin-patient instructed to follow-up with primary care for this issue previously.  Kirk Ruths, MD

## 2020-09-02 ENCOUNTER — Ambulatory Visit: Payer: Managed Care, Other (non HMO) | Admitting: Cardiology

## 2020-09-04 ENCOUNTER — Other Ambulatory Visit: Payer: Self-pay | Admitting: Neurology

## 2020-09-09 ENCOUNTER — Ambulatory Visit: Payer: Managed Care, Other (non HMO) | Admitting: Neurology

## 2020-09-09 ENCOUNTER — Other Ambulatory Visit: Payer: Self-pay

## 2020-09-09 ENCOUNTER — Encounter: Payer: Self-pay | Admitting: Neurology

## 2020-09-09 VITALS — BP 147/88 | HR 87

## 2020-09-09 DIAGNOSIS — R251 Tremor, unspecified: Secondary | ICD-10-CM | POA: Diagnosis not present

## 2020-09-09 MED ORDER — PRIMIDONE 50 MG PO TABS: 100.0000 mg | ORAL_TABLET | Freq: Every day | ORAL | 3 refills | Status: DC

## 2020-09-09 MED ORDER — TOPIRAMATE 25 MG PO TABS: ORAL_TABLET | ORAL | 5 refills | Status: DC

## 2020-09-09 NOTE — Patient Instructions (Signed)
Increase the primidone to 100 mg at bedtime Check labs today  Call for any problems with the medication  See you back in 1 year

## 2020-09-09 NOTE — Progress Notes (Signed)
PATIENT: Dalton Ochoa DOB: [birth year]  REASON FOR VISIT: follow up HISTORY FROM: patient  HISTORY OF PRESENT ILLNESS: Today 09/09/20 Dalton Ochoa is a 60 year old male with history of tremor affecting his right hand.  He remains on Mysoline 50 mg at bedtime.  MRI of the brain has shown minimal white matter changes in the past.  Feels the tremor to the right hand has gradually worsened slowly over time.  Notices the tremor when the hand is turned in motion, such as pouring a drink.  No trouble eating.  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  He works night shift.  Tolerates Mysoline, feels the medication is helpful. Has cut back on caffeine, has helped tremor. Denies any other symptoms. Is working out, reports 8 lb weight loss. His mother had a jaw tremor.  He is on Xarelto, reports has been taking for years.  Here today for evaluation unaccompanied.  HISTORY 09/10/2019 SS: Dalton Ochoa is a 60 year old male with history of tremor affecting his right hand.  The tremor worsens when he is upset.  He remains on Mysoline.  MRI of the brain has shown minimal white matter changes in the past.  His tremor is under good control, does not affect his handwriting or eating.  He works as a Glass blower/designer.  He is tolerating Mysoline without side effect.  Since last seen, says he had SVT, had cardiac ablation, has not had further problems.  He presents today for evaluation unaccompanied.   REVIEW OF SYSTEMS: Out of a complete 14 system review of symptoms, the patient complains only of the following symptoms, and all other reviewed systems are negative.  Tremor  ALLERGIES: Allergies  Allergen Reactions  . Tramadol Hcl Rash    HOME MEDICATIONS: Outpatient Medications Prior to Visit  Medication Sig Dispense Refill  . allopurinol (ZYLOPRIM)  100 MG tablet Take 1 tablet by mouth daily.    . benazepril (LOTENSIN) 20 MG tablet Take 20 mg by mouth daily.    . furosemide (LASIX) 20 MG tablet Take 20 mg by mouth daily.    . hydroxychloroquine (PLAQUENIL) 200 MG tablet Take 1 tablet by mouth 2 (two) times daily.    . metFORMIN (GLUCOPHAGE) 500 MG tablet Take 500 mg by mouth daily.    . metoprolol succinate (TOPROL-XL) 50 MG 24 hr tablet Take 50 mg by mouth daily.    . pravastatin (PRAVACHOL) 20 MG tablet Take 20 mg by mouth daily.     Alveda Reasons 20 MG TABS tablet Take 20 mg by mouth daily.    . primidone (MYSOLINE) 50 MG tablet Take 1 tablet (50 mg total) by mouth at bedtime. Must be seen prior to future refills. 90 tablet 3  . amoxicillin (AMOXIL) 500 MG tablet Take 500 mg by mouth 3 (three) times daily.    . carvedilol (COREG) 12.5 MG tablet TAKE 1 TABLET(12.5 MG) BY MOUTH TWICE DAILY 180 tablet 3   No facility-administered medications prior to visit.    PAST MEDICAL HISTORY: Past Medical History:  Diagnosis Date  . Diabetes mellitus without complication ([place])   . DVT (deep venous thrombosis) ([place])   . Gout   . Hyperlipidemia   . Hypertension   . Inguinal hernia   . Occasional tremors    Right hand  . Pulmonary emboli ([place]) 07/21/2005  .  Raynaud's disease   . Varicose veins   . Venous stasis     PAST SURGICAL HISTORY: Past Surgical History:  Procedure Laterality Date  . ENDOVENOUS ABLATION SAPHENOUS VEIN W/ LASER Right 10-10-2014   EVLA right greater saphenous vein by Curt Jews MD  . ENDOVENOUS ABLATION SAPHENOUS VEIN W/ LASER Left 12-05-2014   endovenous laser ablation left small saphenous vein by Curt Jews MD  . INGUINAL HERNIA REPAIR Right   . NESBIT PROCEDURE N/A 09/02/2017   Procedure: NESBIT PROCEDURE AND BLOCK;  Surgeon: [name], MD;  Location: [facility];  Service: Urology;  Laterality: N/A;  . SVT ABLATION N/A 01/17/2019   Procedure: SVT ABLATION;  Surgeon: Constance Haw, MD;   Location: [facility];  Service: Cardiovascular;  Laterality: N/A;  . TESTICLE REMOVAL Right     FAMILY HISTORY: Family History  Problem Relation Age of Onset  . Deep vein thrombosis Mother   . Hypertension Mother   . Diabetes Father   . Dilated cardiomyopathy Brother     SOCIAL HISTORY: Social History   Socioeconomic History  . Marital status: Married    Spouse name: Lilyan Punt  . Number of children: 2  . Years of education: 41  . Highest education level: Not on file  Occupational History  . 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 Smoking status: Never Smoker  . Smokeless tobacco: Never Used  Vaping Use  . Vaping Use: Never used  Substance and Sexual Activity  . Alcohol use: Yes    Alcohol/week: 0.0 standard drinks    Comment: (2) 16 oz beers every other week  . Drug use: No  . Sexual activity: Not on file  Other Topics Concern  . Not on file  Social History Narrative   Lives with wife and two daughters   Caffeine use: 2 sodas per month   Right handed    Social Determinants of Health   Financial Resource Strain: Not on file  Food Insecurity: Not on file  Transportation Needs: Not on file  Physical Activity: Not on file  Stress: Not on file  Social Connections: Not on file  Intimate Partner Violence: Not on file   PHYSICAL EXAM  Vitals:   09/09/20 1002  BP: (!) 147/88  Pulse: 87   There is no height or weight on file to calculate BMI.  Generalized: Well developed, in no acute distress   Neurological examination  Mentation: Alert oriented to time, place, history taking. Follows all commands speech and language fluent Cranial nerve II-XII: Pupils were equal round reactive to light. Extraocular movements were full, visual field were full on confrontational test. Facial sensation and strength were normal. Head turning and shoulder shrug were normal and symmetric. Motor: The motor testing reveals 5 over 5 strength of all 4 extremities. Good symmetric  motor tone is noted throughout.  Handwriting sample is practically normal, very mild difficulty with certain numbers, but is legible without difficulty. Sensory: Sensory testing is intact to soft touch on all 4 extremities. No evidence of extinction is noted.  Coordination: Cerebellar testing reveals good finger-nose-finger and heel-to-shin bilaterally.  No tremor with finger-nose-finger. Gait and station: Gait is normal. Tandem gait is normal. Romberg is negative. No drift is seen.  Reflexes: Deep tendon reflexes are symmetric and normal bilaterally.   DIAGNOSTIC DATA (LABS, IMAGING, TESTING) - I reviewed patient records, labs, notes, testing and imaging myself where available.  Lab Results  Component Value Date   WBC 5.8 01/17/2019  HGB 17.3 (H) 01/17/2019   HCT 51.1 01/17/2019   MCV 97.1 01/17/2019   PLT 213 01/17/2019      Component Value Date/Time   NA 133 (L) 09/05/2019 0839   K 4.2 09/05/2019 0839   CL 99 09/05/2019 0839   CO2 20 09/05/2019 0839   GLUCOSE 96 09/05/2019 0839   GLUCOSE 124 (H) 01/06/2019 1310   BUN 16 09/05/2019 0839   CREATININE 1.30 (H) 09/05/2019 0839   CALCIUM 8.9 09/05/2019 0839   PROT 7.8 09/09/2016 1003   ALBUMIN 4.6 09/09/2016 1003   AST 29 09/09/2016 1003   ALT 23 09/09/2016 1003   ALKPHOS 84 09/09/2016 1003   BILITOT 0.7 09/09/2016 1003   GFRNONAA 61 09/05/2019 0839   GFRAA 70 09/05/2019 0839   No results found for: CHOL, HDL, LDLCALC, LDLDIRECT, TRIG, CHOLHDL No results found for: HGBA1C No results found for: VITAMINB12 Lab Results  Component Value Date   TSH 1.413 11/15/2018    ASSESSMENT AND PLAN 60 y.o. year old male  has a past medical history of Diabetes mellitus without complication ([place]), DVT (deep venous thrombosis) ([place]), Gout, Hyperlipidemia, Hypertension, Inguinal hernia, Occasional tremors, Pulmonary emboli ([place]) (07/21/2005), Raynaud's disease, Varicose veins, and Venous stasis. here with:  1.  Mild essential tremor, right  upper extremity -There is a potential interaction between primidone and Xarelto, which he is taking for history of pulmonary embolism -I will switch to Topamax, starting with 25 mg at bedtime, working up to 75 mg at bedtime for tremor management -I did not offer propanolol, as he is already taking benazepril, carvedilol, Lasix -He will call for dose adjustment, keep his annual follow-up, call for any problems -He will have labs done today   I spent 30 minutes of face-to-face and non-face-to-face time with patient.  This included previsit chart review, lab review, study review, order entry, electronic health record documentation, patient education.  Butler Denmark, AGNP-C, DNP 09/09/2020, 10:06 AM [facility] [address] [place], [place] [address] [phone]

## 2020-09-09 NOTE — Progress Notes (Signed)
I have read the note, and I agree with the clinical assessment and plan.  Yanique Mulvihill K Jermar Colter   

## 2020-09-12 LAB — CBC WITH DIFFERENTIAL/PLATELET
Basophils Absolute: 0 10*3/uL (ref 0.0–0.2)
Basos: 1 %
EOS (ABSOLUTE): 0.1 10*3/uL (ref 0.0–0.4)
Eos: 2 %
Hematocrit: 47.2 % (ref 37.5–51.0)
Hemoglobin: 16 g/dL (ref 13.0–17.7)
Immature Grans (Abs): 0 10*3/uL (ref 0.0–0.1)
Immature Granulocytes: 0 %
Lymphocytes Absolute: 1.3 10*3/uL (ref 0.7–3.1)
Lymphs: 36 %
MCH: 32.8 pg (ref 26.6–33.0)
MCHC: 33.9 g/dL (ref 31.5–35.7)
MCV: 97 fL (ref 79–97)
Monocytes Absolute: 0.5 10*3/uL (ref 0.1–0.9)
Monocytes: 13 %
Neutrophils Absolute: 1.8 10*3/uL (ref 1.4–7.0)
Neutrophils: 48 %
Platelets: 166 10*3/uL (ref 150–450)
RBC: 4.88 x10E6/uL (ref 4.14–5.80)
RDW: 13 % (ref 11.6–15.4)
WBC: 3.8 10*3/uL (ref 3.4–10.8)

## 2020-09-12 LAB — COMPREHENSIVE METABOLIC PANEL
ALT: 20 IU/L (ref 0–44)
AST: 29 IU/L (ref 0–40)
Albumin/Globulin Ratio: 1.5 (ref 1.2–2.2)
Albumin: 4.6 g/dL (ref 3.8–4.9)
Alkaline Phosphatase: 109 IU/L (ref 44–121)
BUN/Creatinine Ratio: 16 (ref 9–20)
BUN: 24 mg/dL (ref 6–24)
Bilirubin Total: 0.3 mg/dL (ref 0.0–1.2)
CO2: 19 mmol/L — ABNORMAL LOW (ref 20–29)
Calcium: 9.6 mg/dL (ref 8.7–10.2)
Chloride: 101 mmol/L (ref 96–106)
Creatinine, Ser: 1.53 mg/dL — ABNORMAL HIGH (ref 0.76–1.27)
GFR calc Af Amer: 57 mL/min/{1.73_m2} — ABNORMAL LOW (ref >59)
GFR calc non Af Amer: 49 mL/min/{1.73_m2} — ABNORMAL LOW (ref >59)
Globulin, Total: 3.1 g/dL (ref 1.5–4.5)
Glucose: 91 mg/dL (ref 65–99)
Potassium: 4.4 mmol/L (ref 3.5–5.2)
Sodium: 137 mmol/L (ref 134–144)
Total Protein: 7.7 g/dL (ref 6.0–8.5)

## 2020-09-12 LAB — PRIMIDONE, SERUM
Phenobarbital, Serum: NOT DETECTED ug/mL (ref 15–40)
Primidone Lvl: 1.8 ug/mL — ABNORMAL LOW (ref 5.0–12.0)

## 2020-09-16 ENCOUNTER — Telehealth: Payer: Self-pay

## 2020-09-16 NOTE — Telephone Encounter (Signed)
Pt verified by name and DOB,  results given per provider, pt voiced understanding all question answered.  Pt states he is doing well on topamax

## 2020-09-16 NOTE — Telephone Encounter (Signed)
-----   Message from Suzzanne Cloud, NP sent at 09/15/2020 11:23 AM EST ----- CBC is unremarkable, creatinine remains elevated 1.53, this appears chronic.  We stopped primidone, due to potential interaction with anticoagulant.  Started Topamax.  Let me know if he has any problems.

## 2021-01-01 ENCOUNTER — Other Ambulatory Visit: Payer: Self-pay

## 2021-01-01 ENCOUNTER — Other Ambulatory Visit (HOSPITAL_BASED_OUTPATIENT_CLINIC_OR_DEPARTMENT_OTHER): Payer: Self-pay

## 2021-01-01 ENCOUNTER — Ambulatory Visit: Payer: Managed Care, Other (non HMO) | Attending: Internal Medicine

## 2021-01-01 DIAGNOSIS — Z23 Encounter for immunization: Secondary | ICD-10-CM

## 2021-01-01 MED ORDER — PFIZER-BIONT COVID-19 VAC-TRIS 30 MCG/0.3ML IM SUSP
INTRAMUSCULAR | 0 refills | Status: DC
  Filled 2021-01-01: qty 0.3, 1d supply, fill #0

## 2021-01-01 NOTE — Progress Notes (Signed)
   Covid-19 Vaccination Clinic  Name:  Dalton Ochoa    MRN: [id number] DOB: [birth year]  01/01/2021  Mr. [name] was observed post Covid-19 immunization for 15 minutes without incident. He was provided with Vaccine Information Sheet and instruction to access the V-Safe system.   Mr. [name] was instructed to call 911 with any severe reactions post vaccine: Difficulty breathing  Swelling of face and throat  A fast heartbeat  A bad rash all over body  Dizziness and weakness   Immunizations Administered     Name Date Dose VIS Date Route   PFIZER Comrnaty(Gray TOP) Covid-19 Vaccine 01/01/2021  2:22 PM 0.3 mL 07/03/2020 Intramuscular   Manufacturer: [place]   Lot: [id number]   [place] Guerra: [phone]

## 2021-01-28 ENCOUNTER — Ambulatory Visit: Payer: Managed Care, Other (non HMO) | Attending: Internal Medicine

## 2021-01-28 DIAGNOSIS — Z20822 Contact with and (suspected) exposure to covid-19: Secondary | ICD-10-CM

## 2021-01-29 LAB — SARS-COV-2, NAA 2 DAY TAT

## 2021-01-29 LAB — NOVEL CORONAVIRUS, NAA: SARS-CoV-2, NAA: NOT DETECTED

## 2021-04-22 ENCOUNTER — Other Ambulatory Visit: Payer: Self-pay | Admitting: *Deleted

## 2021-04-22 MED ORDER — TOPIRAMATE 25 MG PO TABS: ORAL_TABLET | ORAL | 5 refills | Status: DC

## 2021-07-06 ENCOUNTER — Other Ambulatory Visit (HOSPITAL_BASED_OUTPATIENT_CLINIC_OR_DEPARTMENT_OTHER): Payer: Self-pay

## 2021-07-06 ENCOUNTER — Ambulatory Visit: Payer: Managed Care, Other (non HMO) | Attending: Internal Medicine

## 2021-07-06 DIAGNOSIS — Z23 Encounter for immunization: Secondary | ICD-10-CM

## 2021-07-06 MED ORDER — PFIZER COVID-19 VAC BIVALENT 30 MCG/0.3ML IM SUSP
INTRAMUSCULAR | 0 refills | Status: DC
  Filled 2021-07-06: qty 0.3, 1d supply, fill #0

## 2021-07-06 NOTE — Progress Notes (Signed)
   Covid-19 Vaccination Clinic  Name:  Dalton Ochoa    MRN: [id number] DOB: 02-20-1961  07/06/2021  Mr. Dalton Ochoa was observed post Covid-19 immunization for 15 minutes without incident. He was provided with Vaccine Information Sheet and instruction to access the V-Safe system.   Mr. Dalton Ochoa was instructed to call 911 with any severe reactions post vaccine: Difficulty breathing  Swelling of face and throat  A fast heartbeat  A bad rash all over body  Dizziness and weakness   Immunizations Administered     Name Date Dose VIS Date Route   Pfizer Covid-19 Vaccine Bivalent Booster 07/06/2021  9:20 AM 0.3 mL 03/25/2021 Intramuscular   Manufacturer: [place]   Lot: [id number]   [place]: [phone]

## 2021-09-09 ENCOUNTER — Ambulatory Visit: Payer: 59 | Admitting: Neurology

## 2021-09-09 ENCOUNTER — Encounter: Payer: Self-pay | Admitting: Neurology

## 2021-09-09 VITALS — BP 147/84 | HR 77 | Ht 70.0 in | Wt 247.5 lb

## 2021-09-09 DIAGNOSIS — M1A00X0 Idiopathic chronic gout, unspecified site, without tophus (tophi): Secondary | ICD-10-CM | POA: Insufficient documentation

## 2021-09-09 DIAGNOSIS — F329 Major depressive disorder, single episode, unspecified: Secondary | ICD-10-CM | POA: Insufficient documentation

## 2021-09-09 DIAGNOSIS — G8929 Other chronic pain: Secondary | ICD-10-CM | POA: Insufficient documentation

## 2021-09-09 DIAGNOSIS — I471 Supraventricular tachycardia: Secondary | ICD-10-CM | POA: Insufficient documentation

## 2021-09-09 DIAGNOSIS — I872 Venous insufficiency (chronic) (peripheral): Secondary | ICD-10-CM | POA: Insufficient documentation

## 2021-09-09 DIAGNOSIS — G5600 Carpal tunnel syndrome, unspecified upper limb: Secondary | ICD-10-CM | POA: Insufficient documentation

## 2021-09-09 DIAGNOSIS — E119 Type 2 diabetes mellitus without complications: Secondary | ICD-10-CM | POA: Insufficient documentation

## 2021-09-09 DIAGNOSIS — J300 Vasomotor rhinitis: Secondary | ICD-10-CM | POA: Insufficient documentation

## 2021-09-09 DIAGNOSIS — E7800 Pure hypercholesterolemia, unspecified: Secondary | ICD-10-CM | POA: Insufficient documentation

## 2021-09-09 DIAGNOSIS — N486 Induration penis plastica: Secondary | ICD-10-CM | POA: Insufficient documentation

## 2021-09-09 DIAGNOSIS — F419 Anxiety disorder, unspecified: Secondary | ICD-10-CM | POA: Insufficient documentation

## 2021-09-09 DIAGNOSIS — I82409 Acute embolism and thrombosis of unspecified deep veins of unspecified lower extremity: Secondary | ICD-10-CM | POA: Insufficient documentation

## 2021-09-09 DIAGNOSIS — R519 Headache, unspecified: Secondary | ICD-10-CM | POA: Insufficient documentation

## 2021-09-09 DIAGNOSIS — M109 Gout, unspecified: Secondary | ICD-10-CM | POA: Insufficient documentation

## 2021-09-09 DIAGNOSIS — M351 Other overlap syndromes: Secondary | ICD-10-CM | POA: Insufficient documentation

## 2021-09-09 DIAGNOSIS — N5202 Corporo-venous occlusive erectile dysfunction: Secondary | ICD-10-CM | POA: Insufficient documentation

## 2021-09-09 DIAGNOSIS — I10 Essential (primary) hypertension: Secondary | ICD-10-CM | POA: Insufficient documentation

## 2021-09-09 DIAGNOSIS — E785 Hyperlipidemia, unspecified: Secondary | ICD-10-CM | POA: Insufficient documentation

## 2021-09-09 DIAGNOSIS — D72819 Decreased white blood cell count, unspecified: Secondary | ICD-10-CM | POA: Insufficient documentation

## 2021-09-09 DIAGNOSIS — G250 Essential tremor: Secondary | ICD-10-CM | POA: Insufficient documentation

## 2021-09-09 DIAGNOSIS — E291 Testicular hypofunction: Secondary | ICD-10-CM | POA: Insufficient documentation

## 2021-09-09 MED ORDER — TOPIRAMATE 100 MG PO TABS: 100.0000 mg | ORAL_TABLET | Freq: Every day | ORAL | 3 refills | Status: DC

## 2021-09-09 NOTE — Progress Notes (Signed)
PATIENT: Dalton Ochoa DOB: [birth year]  REASON FOR VISIT: follow up for tremor HISTORY FROM: patient PRIMARY NEUROLOGIST: Dr. Rexene Alberts   HISTORY OF PRESENT ILLNESS: Today 09/09/21 Dalton Ochoa here today for follow-up with history of tremor in the right hand.  Last year he was switched from Mysoline to Topamax due to potential interaction between Mysoline and Xarelto.  Has done well with Topamax.  Denies side effect.  Tremor remains stable in the right hand, with holding an object, or handwriting.  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  He works night shift. Looking forward to [place] trip.   Update 09/09/2020 SS: Dalton Ochoa is a 61 year old male with history of tremor affecting his right hand.  He remains on Mysoline 50 mg at bedtime.  MRI of the brain has shown minimal white matter changes in the past.  Feels the tremor to the right hand has gradually worsened slowly over time.  Notices the tremor when the hand is turned in motion, such as pouring a drink.  No trouble eating.  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  He works night shift.  Tolerates Mysoline, feels the medication is helpful. Has cut back on caffeine, has helped tremor. Denies any other symptoms. Is working out, reports 8 lb weight loss. His mother had a jaw tremor.  He is on Xarelto, reports has been taking for years.  Here today for evaluation unaccompanied.  HISTORY 09/10/2019 SS: Dalton Ochoa is a 61 year old male with history of tremor affecting his right hand.  The tremor worsens when he is upset.  He remains on Mysoline.  MRI of the brain has shown minimal white matter changes in the past.  His tremor is under good control, does not affect his handwriting or eating.  He works as a Glass blower/designer.  He is  tolerating Mysoline without side effect.  Since last seen, says he had SVT, had cardiac ablation, has not had further problems.  He presents today for evaluation unaccompanied.   REVIEW OF SYSTEMS: Out of a complete 14 system review of symptoms, the patient complains only of the following symptoms, and all other reviewed systems are negative.  Tremor  ALLERGIES: Allergies  Allergen Reactions   Tramadol Hcl Rash    HOME MEDICATIONS: Outpatient Medications Prior to Visit  Medication Sig Dispense Refill   allopurinol (ZYLOPRIM) 100 MG tablet Take 1 tablet by mouth daily.     benazepril (LOTENSIN) 20 MG tablet Take 20 mg by mouth daily.     COVID-19 mRNA bivalent vaccine, Pfizer, (PFIZER COVID-19 VAC BIVALENT) injection Inject into the muscle. 0.3 mL 0   COVID-19 mRNA Vac-TriS, Pfizer, (PFIZER-BIONT COVID-19 VAC-TRIS) SUSP injection Inject into the muscle. 0.3 mL 0   furosemide (LASIX) 20 MG tablet Take 20 mg by mouth daily.     hydroxychloroquine (PLAQUENIL) 200 MG tablet Take 1 tablet by mouth 2 (two) times daily.     metFORMIN (GLUCOPHAGE) 500 MG tablet Take 500 mg by mouth daily.     metoprolol succinate (TOPROL-XL) 50 MG 24 hr tablet Take 50 mg by mouth daily.     pravastatin (PRAVACHOL) 20 MG tablet Take 20 mg by mouth daily.      XARELTO 20 MG TABS tablet Take 20 mg by  mouth daily.     topiramate (TOPAMAX) 25 MG tablet 3 tablets at bedtime 90 tablet 5   No facility-administered medications prior to visit.    PAST MEDICAL HISTORY: Past Medical History:  Diagnosis Date   Diabetes mellitus without complication (HCC)    DVT (deep venous thrombosis) (HCC)    Gout    Hyperlipidemia    Hypertension    Inguinal hernia    Occasional tremors    Right hand   Pulmonary emboli ([place]) 07/21/2005   Raynaud's disease    Varicose veins    Venous stasis     PAST SURGICAL HISTORY: Past Surgical History:  Procedure Laterality Date   ENDOVENOUS ABLATION SAPHENOUS VEIN W/ LASER Right  10-10-2014   EVLA right greater saphenous vein by Curt Jews MD   ENDOVENOUS ABLATION SAPHENOUS VEIN W/ LASER Left 12-05-2014   endovenous laser ablation left small saphenous vein by Curt Jews MD   INGUINAL HERNIA REPAIR Right    NESBIT PROCEDURE N/A 09/02/2017   Procedure: NESBIT PROCEDURE AND BLOCK;  Surgeon: [name], MD;  Location: [facility];  Service: Urology;  Laterality: N/A;   SVT ABLATION N/A 01/17/2019   Procedure: SVT ABLATION;  Surgeon: Constance Haw, MD;  Location: [facility];  Service: Cardiovascular;  Laterality: N/A;   TESTICLE REMOVAL Right     FAMILY HISTORY: Family History  Problem Relation Age of Onset   Deep vein thrombosis Mother    Hypertension Mother    Diabetes Father    Dilated cardiomyopathy Brother     SOCIAL HISTORY: Social History   Socioeconomic History   Marital status: Married    Spouse name: Lilyan Punt   Number of children: 2   Years of education: 14   Highest education level: Not on file  Occupational History   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  Tobacco Use   Smoking status: Never   Smokeless tobacco: Never  Vaping Use   Vaping Use: Never used  Substance and Sexual Activity   Alcohol use: Yes    Alcohol/week: 0.0 standard drinks    Comment: (2) 16 oz beers every other week   Drug use: No   Sexual activity: Not on file  Other Topics Concern   Not on file  Social History Narrative   Lives with wife and two daughters   Caffeine use: 2 sodas per month   Right handed    Social Determinants of Health   Financial Resource Strain: Not on file  Food Insecurity: Not on file  Transportation Needs: Not on file  Physical Activity: Not on file  Stress: Not on file  Social Connections: Not on file  Intimate Partner Violence: Not on file   PHYSICAL EXAM  Vitals:   09/09/21 0944  BP: (!) 147/84  Pulse: 77  Weight: 247 lb 8 oz (112.3 kg)  Height: 5\' 10"  (1.778 m)    Body mass index is 35.51  kg/m.  Generalized: Well developed, in no acute distress  Neurological examination  Mentation: Alert oriented to time, place, history taking. Follows all commands speech and language fluent Cranial nerve II-XII: Pupils were equal round reactive to light. Extraocular movements were full, visual field were full on confrontational test. Facial sensation and strength were normal. Head turning and shoulder shrug were normal and symmetric. Motor: The motor testing reveals 5 over 5 strength of all 4 extremities. Good symmetric motor tone is noted throughout.  Handwriting sample looks nearly normal to with with cursive writing, mild trouble with  spiral draw. Sensory: Sensory testing is intact to soft touch on all 4 extremities. No evidence of extinction is noted.  Coordination: Cerebellar testing reveals good finger-nose-finger and heel-to-shin bilaterally.  No tremor with finger-nose-finger. Gait and station: Gait is normal. Tandem gait is normal. Romberg is negative. No drift is seen.  Reflexes: Deep tendon reflexes are symmetric and normal bilaterally.   DIAGNOSTIC DATA (LABS, IMAGING, TESTING) - I reviewed patient records, labs, notes, testing and imaging myself where available.  Lab Results  Component Value Date   WBC 3.8 09/09/2020   HGB 16.0 09/09/2020   HCT 47.2 09/09/2020   MCV 97 09/09/2020   PLT 166 09/09/2020      Component Value Date/Time   NA 137 09/09/2020 1009   K 4.4 09/09/2020 1009   CL 101 09/09/2020 1009   CO2 19 (L) 09/09/2020 1009   GLUCOSE 91 09/09/2020 1009   GLUCOSE 124 (H) 01/06/2019 1310   BUN 24 09/09/2020 1009   CREATININE 1.53 (H) 09/09/2020 1009   CALCIUM 9.6 09/09/2020 1009   PROT 7.7 09/09/2020 1009   ALBUMIN 4.6 09/09/2020 1009   AST 29 09/09/2020 1009   ALT 20 09/09/2020 1009   ALKPHOS 109 09/09/2020 1009   BILITOT 0.3 09/09/2020 1009   GFRNONAA 49 (L) 09/09/2020 1009   GFRAA 57 (L) 09/09/2020 1009   No results found for: CHOL, HDL, LDLCALC,  LDLDIRECT, TRIG, CHOLHDL No results found for: HGBA1C No results found for: VITAMINB12 Lab Results  Component Value Date   TSH 1.413 11/15/2018    ASSESSMENT AND PLAN 61 y.o. year old male  has a past medical history of Diabetes mellitus without complication ([place]), DVT (deep venous thrombosis) ([place]), Gout, Hyperlipidemia, Hypertension, Inguinal hernia, Occasional tremors, Pulmonary emboli ([place]) (07/21/2005), Raynaud's disease, Varicose veins, and Venous stasis. here with:  1.  Mild essential tremor, right upper extremity  -Tremor is overall stable, has done well with the switch from primidone to Topamax -Will increase Topamax slightly to 100 mg at bedtime to hopefully help with tremor symptom management -Is on Toprol-XL which may benefit tremor as well -He will follow-up in 1 year or sooner if needed, he will be followed by Dr. Rexene Alberts since Dr. Jannifer Franklin retired   Butler Denmark, AGNP-C, [place] 09/09/2021, 10:18 AM [facility] [address] [place], [place] [address] [phone]

## 2021-09-09 NOTE — Patient Instructions (Signed)
Increase Topamax 100 mg at bedtime See you back in 1 year with Dr. Rexene Alberts to establish care

## 2021-12-14 ENCOUNTER — Telehealth: Payer: Self-pay

## 2021-12-14 NOTE — Telephone Encounter (Signed)
Primary Cardiologist:None  Chart reviewed as part of pre-operative protocol coverage. Because of Dalton Ochoa's past medical history and time since last visit, he/she will require a follow-up visit in order to better assess preoperative cardiovascular risk.  Pre-op covering staff: - Please schedule appointment and call patient to inform them. - Please contact requesting surgeon's office via preferred method (i.e, phone, fax) to inform them of need for appointment prior to surgery.  If applicable, this message will also be routed to pharmacy pool and/or primary cardiologist for input on holding anticoagulant/antiplatelet agent as requested below so that this information is available at time of patient's appointment.   Emmaline Life, NP-C    12/14/2021, 4:32 PM [place] [address] Office [phone] Fax [phone]

## 2021-12-14 NOTE — Telephone Encounter (Signed)
   Pre-operative Risk Assessment    Patient Name: Dalton Ochoa  DOB: 1960-12-06 MRN: [id number]      Request for Surgical Clearance    Procedure:   COLONOSCOPY   Date of Surgery:  Clearance 01/27/22                                 Surgeon:  DR. Alessandra Bevels  Surgeon's Group or Practice Name:  [facility]  Phone number:  [phone] Fax number:  [phone]   Type of Clearance Requested:   - Medical    Type of Anesthesia:  Not Indicated   Additional requests/questions:    SignedZebedee Iba   12/14/2021, 3:17 PM

## 2021-12-15 NOTE — Telephone Encounter (Signed)
Pt is scheduled to see Dr. [name] on 5/24 for clearance

## 2021-12-16 ENCOUNTER — Ambulatory Visit (INDEPENDENT_AMBULATORY_CARE_PROVIDER_SITE_OTHER): Payer: 59 | Admitting: Cardiology

## 2021-12-16 ENCOUNTER — Encounter: Payer: Self-pay | Admitting: Cardiology

## 2021-12-16 VITALS — BP 132/85 | HR 65 | Ht 70.0 in | Wt 242.8 lb

## 2021-12-16 DIAGNOSIS — I10 Essential (primary) hypertension: Secondary | ICD-10-CM

## 2021-12-16 DIAGNOSIS — E7800 Pure hypercholesterolemia, unspecified: Secondary | ICD-10-CM

## 2021-12-16 DIAGNOSIS — I471 Supraventricular tachycardia: Secondary | ICD-10-CM

## 2021-12-16 DIAGNOSIS — R002 Palpitations: Secondary | ICD-10-CM

## 2021-12-16 NOTE — Progress Notes (Signed)
HPI: FU SVT. Patient presented 4/20 with complaints of palpitations, weakness and chest heaviness. Electrocardiogram showed supraventricular tachycardia, right bundle branch block. Echocardiogram June 2020 showed normal LV function, severe left ventricular hypertrophy, severe left atrial enlargement.  There was concern about hypertrophic cardiomyopathy.  Nuclear study June 2020 showed ejection fraction 48% but no ischemia. 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  PYP scan July 2020 not suggestive of amyloid.  Patient seen by Dr. Curt Bears in follow-up July 2020.  He continued with palpitations and he was changed to carvedilol.  Also with history of elevated hemoglobin. Since last seen there is no dyspnea, chest pain or syncope.  Occasional brief palpitations not sustained.  Current Outpatient Medications  Medication Sig Dispense Refill   allopurinol (ZYLOPRIM) 100 MG tablet Take 1 tablet by mouth daily.     benazepril (LOTENSIN) 20 MG tablet Take 20 mg by mouth daily.     COVID-19 mRNA bivalent vaccine, Pfizer, (PFIZER COVID-19 VAC BIVALENT) injection Inject into the muscle. 0.3 mL 0   COVID-19 mRNA Vac-TriS, Pfizer, (PFIZER-BIONT COVID-19 VAC-TRIS) SUSP injection Inject into the muscle. 0.3 mL 0   furosemide (LASIX) 20 MG tablet Take 20 mg by mouth daily.     hydroxychloroquine (PLAQUENIL) 200 MG tablet Take 1 tablet by mouth 2 (two) times daily.     metFORMIN (GLUCOPHAGE) 500 MG tablet Take 500 mg by mouth daily.     metoprolol succinate (TOPROL-XL) 50 MG 24 hr tablet Take 50 mg by mouth daily.     pravastatin (PRAVACHOL) 20 MG tablet Take 20 mg by mouth daily.      topiramate (TOPAMAX) 100 MG tablet Take 1 tablet (100 mg total) by mouth at bedtime. 90 tablet 3   XARELTO 20 MG TABS tablet Take 20 mg by mouth daily.     No current facility-administered medications for this visit.     Past Medical History:  Diagnosis Date   Diabetes mellitus without complication (HCC)    DVT (deep venous  thrombosis) (HCC)    Gout    Hyperlipidemia    Hypertension    Inguinal hernia    Occasional tremors    Right hand   Pulmonary emboli ([place]) 07/21/2005   Raynaud's disease    Varicose veins    Venous stasis     Past Surgical History:  Procedure Laterality Date   ENDOVENOUS ABLATION SAPHENOUS VEIN W/ LASER Right 10-10-2014   EVLA right greater saphenous vein by Curt Jews MD   ENDOVENOUS ABLATION SAPHENOUS VEIN W/ LASER Left 12-05-2014   endovenous laser ablation left small saphenous vein by Curt Jews MD   INGUINAL HERNIA REPAIR Right    NESBIT PROCEDURE N/A 09/02/2017   Procedure: NESBIT PROCEDURE AND BLOCK;  Surgeon: [name], MD;  Location: [facility];  Service: Urology;  Laterality: N/A;   SVT ABLATION N/A 01/17/2019   Procedure: SVT ABLATION;  Surgeon: Constance Haw, MD;  Location: [place] [facility];  Service: Cardiovascular;  Laterality: N/A;   TESTICLE REMOVAL Right     Social History   Socioeconomic History   Marital status: Married    Spouse name: Lilyan Punt   Number of children: 2   Years of education: 14   Highest education level: Not on file  Occupational History   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  Tobacco Use   Smoking status: Never   Smokeless tobacco: Never  Vaping Use   Vaping Use: Never used  Substance and Sexual Activity   Alcohol use:  Yes    Alcohol/week: 0.0 standard drinks    Comment: (2) 16 oz beers every other week   Drug use: No   Sexual activity: Not on file  Other Topics Concern   Not on file  Social History Narrative   Lives with wife and two daughters   Caffeine use: 2 sodas per month   Right handed    Social Determinants of Health   Financial Resource Strain: Not on file  Food Insecurity: Not on file  Transportation Needs: Not on file  Physical Activity: Not on file  Stress: Not on file  Social Connections: Not on file  Intimate Partner Violence: Not on file    Family History  Problem Relation Age of Onset    Deep vein thrombosis Mother    Hypertension Mother    Diabetes Father    Dilated cardiomyopathy Brother     ROS: no fevers or chills, productive cough, hemoptysis, dysphasia, odynophagia, melena, hematochezia, dysuria, hematuria, rash, seizure activity, orthopnea, PND, pedal edema, claudication. Remaining systems are negative.  Physical Exam: Well-developed well-nourished in no acute distress.  Skin is warm and dry.  HEENT is normal.  Neck is supple.  Chest is clear to auscultation with normal expansion.  Cardiovascular exam is regular rate and rhythm.  Abdominal exam nontender or distended. No masses palpated. Extremities show no edema. neuro grossly intact  ECG-normal sinus rhythm at a rate of 65, first-degree AV block, right bundle branch block.  Personally reviewed  A/P  1 history of SVT-status post ablation of AVNRT with no recurrences by history.  2 hypertension-blood pressure controlled.  Continue present medications.  3 palpitations-no recurrent symptoms.  We will continue beta-blocker.  No we will plan to repeat echocardiogram as there is a question of hypertrophic cardiomyopathy in the past.  4 history of chest pain-no recurrences.  Previous functional study showed no ischemia.  5 history of pulmonary embolus-anticoagulation managed by primary care.  Kirk Ruths, MD

## 2021-12-16 NOTE — Patient Instructions (Signed)
  Testing/Procedures:  Your physician has requested that you have an echocardiogram. Echocardiography is a painless test that uses sound waves to create images of your heart. It provides your doctor with information about the size and shape of your heart and how well your heart's chambers and valves are working. This procedure takes approximately one hour. There are no restrictions for this procedure. [place]   Follow-Up: At [facility], you and your health needs are our priority.  As part of our continuing mission to provide you with exceptional heart care, we have created designated Provider Care Teams.  These Care Teams include your primary Cardiologist (physician) and Advanced Practice Providers (APPs -  Physician Assistants and Nurse Practitioners) who all work together to provide you with the care you need, when you need it.  We recommend signing up for the patient portal called "MyChart".  Sign up information is provided on this After Visit Summary.  MyChart is used to connect with patients for Virtual Visits (Telemedicine).  Patients are able to view lab/test results, encounter notes, upcoming appointments, etc.  Non-urgent messages can be sent to your provider as well.   To learn more about what you can do with MyChart, go to [email or web address].    Your next appointment:   12 month(s)  The format for your next appointment:   In Person  Provider:   Kirk Ruths MD    Important Information About [place]

## 2022-01-07 ENCOUNTER — Telehealth: Payer: Self-pay

## 2022-01-07 NOTE — Telephone Encounter (Signed)
Request for clearance for upcoming colonoscopy. Patient was recently seen by you on 12/16/21. He is scheduled for echo on 6/16 for question of hypertrophic cardiomyopathy. Is he cleared to proceed with colonoscopy pending stable echo result?  Please route your response to p cv div preop.  Thank you, Emmaline Life, NP-C    01/07/2022, 12:13 PM [place] [address] Office [phone] Fax [phone]

## 2022-01-07 NOTE — Telephone Encounter (Signed)
   Pre-operative Risk Assessment    Patient Name: Dalton Ochoa  DOB: 1961-05-06 MRN: [id number]      Request for Surgical Clearance    Procedure:   Colonoscopy  Date of Surgery:  Clearance 01/27/22                                 Surgeon:  Otis Brace, MD, FACP Surgeon's Group or Practice Name:  [facility] Phone number:  [phone] Fax number:  [phone]   Type of Clearance Requested:   - Medical    Type of Anesthesia:  Not Indicated   Additional requests/questions:    SignedWonda Horner   01/07/2022, 9:35 AM

## 2022-01-08 ENCOUNTER — Ambulatory Visit (HOSPITAL_COMMUNITY): Payer: 59 | Attending: Cardiology

## 2022-01-08 DIAGNOSIS — I471 Supraventricular tachycardia: Secondary | ICD-10-CM | POA: Diagnosis present

## 2022-01-11 NOTE — Telephone Encounter (Signed)
   Primary Cardiologist: Kirk Ruths, MD  Chart reviewed as part of pre-operative protocol coverage. Given past medical history and time since last visit, based on ACC/AHA guidelines, Dalton Ochoa would be at acceptable risk for the planned procedure without further cardiovascular testing.    I will route this recommendation to the requesting party via Epic fax function and remove from pre-op pool.  Please call with questions.  Dalton Ng. Avary Pitsenbarger NP-C    01/11/2022, 5:58 AM [place] [address] Office [phone] Fax [phone]

## 2022-02-04 ENCOUNTER — Telehealth: Payer: Self-pay

## 2022-02-04 NOTE — Telephone Encounter (Signed)
-----   Message from Otis Brace, MD sent at 02/04/2022  2:05 PM EDT ----- Regarding: RE: pt needs EMR September / October is ok . Color copy and path report mailed . ----- Message ----- From: Timothy Lasso, RN Sent: 02/04/2022   1:59 PM EDT To: Otis Brace, MD Subject: FW: pt needs EMR                               Dr Alessandra Bevels please see note per Dr Alveta Heimlich and let me know how you would like to proceed.  Is this too far out for your pt?  ----- Message ----- From: Irving Copas., MD Sent: 02/04/2022   1:46 PM EDT To: Otis Brace, MD; Timothy Lasso, RN; # Subject: RE: pt needs EMR                               I have reviewed the media tab records. I need to see the actual colonoscopy report as well as the color images. Please update the referring providers that I am scheduling procedures into September/October for these types of resections. Happy to meet the patient if they feel that he is stable enough for [place] to perform a procedure around that time otherwise they will have to refer the patient elsewhere. Patient can be offered a clinic visit as we do not have access to our scheduling for September/October. Thanks. GM ----- Message ----- From: Irven [place] Sent: 02/04/2022   1:22 PM EDT To: Irving Copas., MD Subject: pt needs EMR                                   Dr. Rush Landmark,  Please see referral from [facility]. I've scanned paperwork into the referral. Patient is being referred to you for EMR.   Thank you, Steph

## 2022-02-05 ENCOUNTER — Telehealth: Payer: Self-pay

## 2022-02-05 ENCOUNTER — Other Ambulatory Visit: Payer: Self-pay

## 2022-02-05 DIAGNOSIS — Z86010 Personal history of colonic polyps: Secondary | ICD-10-CM

## 2022-02-05 MED ORDER — PEG 3350-KCL-NA BICARB-NACL 420 G PO SOLR: 4000.0000 mL | Freq: Once | ORAL | 0 refills | Status: AC

## 2022-02-05 NOTE — Telephone Encounter (Signed)
Left message on machine to call back  

## 2022-02-05 NOTE — Telephone Encounter (Signed)
[facility] Pre-operative Risk Assessment     Request for surgical clearance:     Endoscopy Procedure  What type of surgery is being performed?     Colon EMR  When is this surgery scheduled?     03/24/22  What type of clearance is required ?   Pharmacy  Are there any medications that need to be held prior to surgery and how long? Xarelto  Practice name and name of physician performing surgery?      [facility]  What is your office phone and fax number?      Phone- [phone]  [phone]  Anesthesia type (None, local, MAC, general) ?       MAC

## 2022-02-05 NOTE — Telephone Encounter (Signed)
Colon EMR 03/24/22 at 1030 am at [facility] with GM

## 2022-02-08 NOTE — Telephone Encounter (Signed)
Colon EMR  scheduled, pt instructed and medications reviewed.  Patient instructions mailed to home.  Patient to call with any questions or concerns.  The pt will await a call from our office regarding xarelto.

## 2022-02-08 NOTE — Telephone Encounter (Signed)
Pt does not take Xarelto for a cardiac indication, we are not prescribing/managing his anticoag. Recommend clearance come from managing provider.

## 2022-02-08 NOTE — Telephone Encounter (Signed)
Request sent to Dr Kenton Kingfisher for [place] hold.

## 2022-02-09 NOTE — Telephone Encounter (Signed)
8/10 at 230 pm appt has been made to see Dr Rush Landmark.  The pt has been advised.

## 2022-02-18 ENCOUNTER — Telehealth: Payer: Self-pay

## 2022-02-18 NOTE — Telephone Encounter (Signed)
-----   Message from Timothy Lasso, RN sent at 02/08/2022 11:59 AM EDT ----- Make sure to have Xarelto hold from Dr Kenton Kingfisher

## 2022-02-18 NOTE — Telephone Encounter (Signed)
Resent letter to Dr Kenton Kingfisher for xarelto clearance.

## 2022-02-24 DIAGNOSIS — Z7901 Long term (current) use of anticoagulants: Secondary | ICD-10-CM | POA: Insufficient documentation

## 2022-02-24 DIAGNOSIS — N1831 Chronic kidney disease, stage 3a: Secondary | ICD-10-CM | POA: Insufficient documentation

## 2022-03-02 ENCOUNTER — Telehealth: Payer: Self-pay

## 2022-03-02 NOTE — Telephone Encounter (Signed)
Left message on machine to call back  

## 2022-03-02 NOTE — Telephone Encounter (Signed)
-----   Message from Timothy Lasso, RN sent at 03/01/2022  9:34 AM EDT -----  ----- Message ----- From: Timothy Lasso, RN Sent: 03/01/2022  12:00 AM EDT To: Timothy Lasso, RN   ----- Message ----- From: Timothy Lasso, RN Sent: 02/26/2022  12:00 AM EDT To: Timothy Lasso, RN   ----- Message ----- From: Timothy Lasso, RN Sent: 02/23/2022  12:00 AM EDT To: Timothy Lasso, RN  Make sure to have xarelto hold

## 2022-03-03 NOTE — Telephone Encounter (Signed)
I spoke with the pt and he tells me that Dr Kenton Kingfisher is the prescriber of Xarelto.  I have tried to reach out multiple times for a response.  I have not gotten a reply back.  The pt will also call that office and try to get an answer.  He also will keep appt for tomorrow in the office with Dr Rush Landmark.  I did resend the letter to Dr Kenton Kingfisher as well.

## 2022-03-04 ENCOUNTER — Encounter: Payer: Self-pay | Admitting: Gastroenterology

## 2022-03-04 ENCOUNTER — Ambulatory Visit: Payer: 59 | Admitting: Gastroenterology

## 2022-03-04 VITALS — BP 120/80 | HR 72 | Ht 69.5 in | Wt 243.4 lb

## 2022-03-04 DIAGNOSIS — D126 Benign neoplasm of colon, unspecified: Secondary | ICD-10-CM | POA: Diagnosis not present

## 2022-03-04 DIAGNOSIS — Z7901 Long term (current) use of anticoagulants: Secondary | ICD-10-CM | POA: Diagnosis not present

## 2022-03-04 DIAGNOSIS — K635 Polyp of colon: Secondary | ICD-10-CM | POA: Diagnosis not present

## 2022-03-04 NOTE — H&P (View-Only) (Signed)
[place] VISIT   Primary Care Provider [name], MD [address] [place] [address] [phone]  Referring Provider Dr. Alessandra Bevels  Patient Profile: Dalton Ochoa is a 61 y.o. 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  The patient presents to the [facility] for an evaluation and management of problem(s) noted below:  Problem List 1. Cecal polyp   2. Tubulovillous adenoma of colon   3. Chronic anticoagulation     History of Present Illness This is the patient's first visit to the outpatient [place] clinic.  The patient underwent his first colon cancer screening earlier this year in July.  He was found to have multiple colon polyps and a very large cecal polyp that was biopsied and returned as a tubulovillous adenoma.  It is for this reason that the patient is referred for consideration of advanced endoscopic resection prior to colectomy.  The patient otherwise is doing well and not having any other GI symptoms at this time.  GI Review of Systems Positive as above Negative for dysphagia, odynophagia, nausea, vomiting, pain, alteration of bowel habits, melena, hematochezia  Review of Systems General: Denies fevers/chills/weight loss unintentionally Cardiovascular: Denies chest pain Pulmonary: Denies shortness of breath Gastroenterological: See HPI Genitourinary: Denies darkened urine Hematological: Positive for history of easy bruising/bleeding due to Xarelto Dermatological: Denies jaundice Psychological: Mood is stable   Medications Current Outpatient Medications  Medication Sig Dispense Refill   allopurinol (ZYLOPRIM) 100 MG tablet Take 1 tablet by mouth daily.     benazepril (LOTENSIN) 40 MG tablet Take  40 mg by mouth daily.     buPROPion (WELLBUTRIN XL) 300 MG 24 hr tablet Take 300 mg by mouth daily.     carvedilol (COREG) 12.5 MG tablet Take 1 tablet by mouth as needed.     furosemide (LASIX) 20 MG tablet Take 20 mg by mouth daily.     hydroxychloroquine (PLAQUENIL) 200 MG tablet Take 1 tablet by mouth 2 (two) times daily.     metFORMIN (GLUCOPHAGE) 500 MG tablet Take 500 mg by mouth daily.     metoprolol succinate (TOPROL-XL) 50 MG 24 hr tablet Take 50 mg by mouth daily.     pravastatin (PRAVACHOL) 20 MG tablet Take 20 mg by mouth daily.      pregabalin (LYRICA) 75 MG capsule Take 75 mg by mouth 2 (two) times daily.     topiramate (TOPAMAX) 100 MG tablet Take 1 tablet (100 mg total) by mouth at bedtime. 90 tablet 3   XARELTO 20 MG TABS tablet Take 20 mg by mouth daily.     No current facility-administered medications for this visit.    Allergies Allergies  Allergen Reactions   Tramadol Hcl Rash    Histories Past Medical History:  Diagnosis Date   Diabetes mellitus without complication (HCC)    DVT (deep venous thrombosis) (HCC)    Gout    Hyperlipidemia    Hypertension    Inguinal hernia    Occasional tremors    Right hand   Pulmonary emboli ([place]) 07/21/2005   Raynaud's disease    Varicose veins    Venous stasis    Past Surgical History:  Procedure Laterality Date   ENDOVENOUS ABLATION SAPHENOUS VEIN W/ LASER Right 10/10/2014   EVLA right greater saphenous vein by Curt Jews MD  ENDOVENOUS ABLATION SAPHENOUS VEIN W/ LASER Left 12/05/2014   endovenous laser ablation left small saphenous vein by Curt Jews MD   INGUINAL HERNIA REPAIR Right    NESBIT PROCEDURE N/A 09/02/2017   Procedure: NESBIT PROCEDURE AND BLOCK;  Surgeon: [name], MD;  Location: [facility];  Service: Urology;  Laterality: N/A;   SVT ABLATION N/A 01/17/2019   Procedure: SVT ABLATION;  Surgeon: Constance Haw, MD;  Location: [place] [facility];  Service: Cardiovascular;   Laterality: N/A;   TESTICLE REMOVAL Right    TONSILLECTOMY     Social History   Socioeconomic History   Marital status: Married    Spouse name: Mona   Number of children: 2   Years of education: 14   Highest education level: Not on file  Occupational History   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  Tobacco Use   Smoking status: Never   Smokeless tobacco: Never  Vaping Use   Vaping Use: Never used  Substance and Sexual Activity   Alcohol use: Yes    Comment: 1 beer every 3 days   Drug use: No   Sexual activity: Not on file  Other Topics Concern   Not on file  Social History Narrative   Lives with wife and two daughters   Caffeine use: 2 sodas per month   Right handed    Social Determinants of Health   Financial Resource Strain: Not on file  Food Insecurity: Not on file  Transportation Needs: Not on file  Physical Activity: Not on file  Stress: Not on file  Social Connections: Not on file  Intimate Partner Violence: Not on file   Family History  Problem Relation Age of Onset   Hypertension Mother    Diabetes Father    Deep vein thrombosis Father    Dilated cardiomyopathy Brother    Heart disease Brother    Diabetes Paternal Grandmother    Heart disease Daughter    Colon cancer Neg Hx    Esophageal cancer Neg Hx    Inflammatory bowel disease Neg Hx    Liver disease Neg Hx    Pancreatic cancer Neg Hx    Rectal cancer Neg Hx    Stomach cancer Neg Hx    I have reviewed his medical, social, and family history in detail and updated the electronic medical record as necessary.    PHYSICAL EXAMINATION  BP 120/80 (BP Location: Left Arm, Patient Position: Sitting, Cuff Size: Normal)   Pulse 72   Ht 5' 9.5" (1.765 m) Comment: height measured without shoes  Wt 243 lb 6 oz (110.4 kg)   BMI 35.42 kg/m  Wt Readings from Last 3 Encounters:  03/04/22 243 lb 6 oz (110.4 kg)  12/16/21 242 lb 12.8 oz (110.1 kg)  09/09/21 247 lb 8 oz (112.3 kg)  GEN: NAD, appears stated age,  doesn't appear chronically ill PSYCH: Cooperative, without pressured speech EYE: Conjunctivae pink, sclerae anicteric ENT: MMM CV: Nontachycardic RESP: No audible wheezing GI: NABS, soft, obese, rounded, nontender, without rebound or guarding MSK/EXT: No significant lower extremity edema SKIN: No jaundice NEURO:  Alert & Oriented x 3, no focal deficits   REVIEW OF DATA  I reviewed the following data at the time of this encounter:  GI Procedures and Studies  January 27, 2022 colonoscopy [facility] Perianal skin tags found on perianal exam. The examined portion of the ileum was normal. 1, 25 mm polyp in the cecum.  Biopsied. 2, 6 to 8 mm polyps in  the ascending colon, removed with a cold snare.  Resected and retrieved. One 6 mm polyp in the descending colon, removed with a cold snare.  Resected and retrieved. One 5 mm polyp in the rectum, removed with a cold snare.  Resected and retrieved. Internal hemorrhoids.  Pathology Cecal biopsy- tubulovillous adenoma Large intestine ascending colon polyp-tubular adenomas Large intestine rectum/descending polyps-tubular adenoma/hyperplastic polyp  Laboratory Studies  Reviewed those in epic and care everywhere  Imaging Studies  No relevant studies to review   ASSESSMENT  Mr. Cellucci is a 61 y.o. 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  The patient is seen today for evaluation and management of:  1. Cecal polyp   2. Tubulovillous adenoma of colon   3. Chronic anticoagulation    The patient is hemodynamically and clinically stable.  Based upon the description and endoscopic pictures I do feel that it is reasonable to pursue an Advanced Polypectomy attempt of the polyp/lesion.  We discussed some of the techniques of advanced polypectomy which include  Endoscopic Mucosal Resection, OVESCO Full-Thickness Resection, Endorotor Morcellation, and Tissue Ablation via Fulguration.  We also reviewed images of typical techniques as noted above.  The risks and benefits of endoscopic evaluation were discussed with the patient; these include but are not limited to the risk of perforation, infection, bleeding, missed lesions, lack of diagnosis, severe illness requiring hospitalization, as well as anesthesia and sedation related illnesses.  During attempts at advanced resection, the risks of bleeding and perforation/leak are increased as opposed to diagnostic and screening procedures, and that was discussed with the patient as well.   In addition, I explained that with the possible need for piecemeal resection, subsequent short-interval endoscopic evaluation for follow up and potential retreatment of the lesion/area may be necessary.  I did offer, a referral to surgery in order for patient to have opportunity to discuss surgical management/intervention prior to finalizing decision for attempt at endoscopic removal, however, the patient deferred on this.  If, after attempt at removal of the polyp/lesion, it is found that the patient has a complication or that an invasive lesion or malignant lesion is found, or that the polyp/lesion continues to recur, the patient is aware and understands that surgery may still be indicated/required.  All patient questions were answered, to the best of my ability, and the patient agrees to the aforementioned plan of action with follow-up as indicated.   PLAN  Procedure labs to be obtained as outlined below Proceed with scheduled colonoscopy with EMR attempt of cecal polyp Follow-up to be dictated by results at colonoscopy   Orders Placed This Encounter  Procedures   CBC with Differential/Platelet   Basic Metabolic Panel (BMET)   Protime-INR    New Prescriptions   No medications on file   Modified Medications   No medications  on file    Planned Follow Up No follow-ups on file.   Total Time in Face-to-Face and in Coordination of Care for patient including independent/personal interpretation/review of prior testing, medical history, examination, medication adjustment, communicating results with the patient directly, and documentation within the EHR is 45 minutes.   Justice Britain, MD [facility] Advanced Endoscopy Office # [phone]

## 2022-03-04 NOTE — Patient Instructions (Addendum)
_______________________________________________________  If you are age 59 or older, your body mass index should be between 23-30. Your Body mass index is 35.42 kg/m. If this is out of the aforementioned range listed, please consider follow up with your Primary Care Provider.  If you are age 76 or younger, your body mass index should be between 19-25. Your Body mass index is 35.42 kg/m. If this is out of the aformentioned range listed, please consider follow up with your Primary Care Provider.   ________________________________________________________  The [facility] providers would like to encourage you to use [facility] to communicate with providers for non-urgent requests or questions.  Due to long hold times on the telephone, sending your provider a message by [facility] may be a faster and more efficient way to get a response.  Please allow 48 business hours for a response.  Please remember that this is for non-urgent requests.  _______________________________________________________  Dennis Bast have been scheduled for a EMR colonoscopy. Please follow written instructions given to you at your visit today.  Please pick up your prep supplies at the pharmacy within the next 1-3 days. If you use inhalers (even only as needed), please bring them with you on the day of your procedure.  The [facility] providers would like to encourage you to use [facility] to communicate with providers for non-urgent requests or questions.  Due to long hold times on the telephone, sending your provider a message by [facility] may be a faster and more efficient way to get a response.  Please allow 48 business hours for a response.  Please remember that this is for non-urgent requests.  _______________________________________________________  Your provider has requested that you go to the basement level for lab work before leaving today. Press "B" on the elevator. The lab is located at the first door on the left as you exit the  elevator.  You will be contacted by our office prior to your procedure for directions on holding your XARELTO.  If you do not hear from our office 1 week prior to your scheduled procedure, please call [phone] to discuss.     Due to recent changes in healthcare laws, you may see the results of your imaging and laboratory studies on MyChart before your provider has had a chance to review them.  We understand that in some cases there may be results that are confusing or concerning to you. Not all laboratory results come back in the same time frame and the provider may be waiting for multiple results in order to interpret others.  Please give [place] 48 hours in order for your provider to thoroughly review all the results before contacting the office for clarification of your results.   It was a pleasure to see you today!  Thank you for trusting me with your gastrointestinal care!

## 2022-03-04 NOTE — Progress Notes (Signed)
[place] VISIT   Primary Care Provider [name], MD [address] [place] [address] [phone]  Referring Provider Dr. Alessandra Bevels  Patient Profile: Dalton Ochoa is a 61 y.o. 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  The patient presents to the [facility] for an evaluation and management of problem(s) noted below:  Problem List 1. Cecal polyp   2. Tubulovillous adenoma of colon   3. Chronic anticoagulation     History of Present Illness This is the patient's first visit to the outpatient [place] clinic.  The patient underwent his first colon cancer screening earlier this year in July.  He was found to have multiple colon polyps and a very large cecal polyp that was biopsied and returned as a tubulovillous adenoma.  It is for this reason that the patient is referred for consideration of advanced endoscopic resection prior to colectomy.  The patient otherwise is doing well and not having any other GI symptoms at this time.  GI Review of Systems Positive as above Negative for dysphagia, odynophagia, nausea, vomiting, pain, alteration of bowel habits, melena, hematochezia  Review of Systems General: Denies fevers/chills/weight loss unintentionally Cardiovascular: Denies chest pain Pulmonary: Denies shortness of breath Gastroenterological: See HPI Genitourinary: Denies darkened urine Hematological: Positive for history of easy bruising/bleeding due to Xarelto Dermatological: Denies jaundice Psychological: Mood is stable   Medications Current Outpatient Medications  Medication Sig Dispense Refill   allopurinol (ZYLOPRIM) 100 MG tablet Take 1 tablet by mouth daily.     benazepril (LOTENSIN) 40 MG tablet Take  40 mg by mouth daily.     buPROPion (WELLBUTRIN XL) 300 MG 24 hr tablet Take 300 mg by mouth daily.     carvedilol (COREG) 12.5 MG tablet Take 1 tablet by mouth as needed.     furosemide (LASIX) 20 MG tablet Take 20 mg by mouth daily.     hydroxychloroquine (PLAQUENIL) 200 MG tablet Take 1 tablet by mouth 2 (two) times daily.     metFORMIN (GLUCOPHAGE) 500 MG tablet Take 500 mg by mouth daily.     metoprolol succinate (TOPROL-XL) 50 MG 24 hr tablet Take 50 mg by mouth daily.     pravastatin (PRAVACHOL) 20 MG tablet Take 20 mg by mouth daily.      pregabalin (LYRICA) 75 MG capsule Take 75 mg by mouth 2 (two) times daily.     topiramate (TOPAMAX) 100 MG tablet Take 1 tablet (100 mg total) by mouth at bedtime. 90 tablet 3   XARELTO 20 MG TABS tablet Take 20 mg by mouth daily.     No current facility-administered medications for this visit.    Allergies Allergies  Allergen Reactions   Tramadol Hcl Rash    Histories Past Medical History:  Diagnosis Date   Diabetes mellitus without complication (HCC)    DVT (deep venous thrombosis) (HCC)    Gout    Hyperlipidemia    Hypertension    Inguinal hernia    Occasional tremors    Right hand   Pulmonary emboli ([place]) 07/21/2005   Raynaud's disease    Varicose veins    Venous stasis    Past Surgical History:  Procedure Laterality Date   ENDOVENOUS ABLATION SAPHENOUS VEIN W/ LASER Right 10/10/2014   EVLA right greater saphenous vein by Curt Jews MD  ENDOVENOUS ABLATION SAPHENOUS VEIN W/ LASER Left 12/05/2014   endovenous laser ablation left small saphenous vein by Curt Jews MD   INGUINAL HERNIA REPAIR Right    NESBIT PROCEDURE N/A 09/02/2017   Procedure: NESBIT PROCEDURE AND BLOCK;  Surgeon: [name], MD;  Location: [facility];  Service: Urology;  Laterality: N/A;   SVT ABLATION N/A 01/17/2019   Procedure: SVT ABLATION;  Surgeon: Constance Haw, MD;  Location: [facility];  Service: Cardiovascular;   Laterality: N/A;   TESTICLE REMOVAL Right    TONSILLECTOMY     Social History   Socioeconomic History   Marital status: Married    Spouse name: Mona   Number of children: 2   Years of education: 14   Highest education level: Not on file  Occupational History   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  Tobacco Use   Smoking status: Never   Smokeless tobacco: Never  Vaping Use   Vaping Use: Never used  Substance and Sexual Activity   Alcohol use: Yes    Comment: 1 beer every 3 days   Drug use: No   Sexual activity: Not on file  Other Topics Concern   Not on file  Social History Narrative   Lives with wife and two daughters   Caffeine use: 2 sodas per month   Right handed    Social Determinants of Health   Financial Resource Strain: Not on file  Food Insecurity: Not on file  Transportation Needs: Not on file  Physical Activity: Not on file  Stress: Not on file  Social Connections: Not on file  Intimate Partner Violence: Not on file   Family History  Problem Relation Age of Onset   Hypertension Mother    Diabetes Father    Deep vein thrombosis Father    Dilated cardiomyopathy Brother    Heart disease Brother    Diabetes Paternal Grandmother    Heart disease Daughter    Colon cancer Neg Hx    Esophageal cancer Neg Hx    Inflammatory bowel disease Neg Hx    Liver disease Neg Hx    Pancreatic cancer Neg Hx    Rectal cancer Neg Hx    Stomach cancer Neg Hx    I have reviewed his medical, social, and family history in detail and updated the electronic medical record as necessary.    PHYSICAL EXAMINATION  BP 120/80 (BP Location: Left Arm, Patient Position: Sitting, Cuff Size: Normal)   Pulse 72   Ht 5' 9.5" (1.765 m) Comment: height measured without shoes  Wt 243 lb 6 oz (110.4 kg)   BMI 35.42 kg/m  Wt Readings from Last 3 Encounters:  03/04/22 243 lb 6 oz (110.4 kg)  12/16/21 242 lb 12.8 oz (110.1 kg)  09/09/21 247 lb 8 oz (112.3 kg)  GEN: NAD, appears stated age,  doesn't appear chronically ill PSYCH: Cooperative, without pressured speech EYE: Conjunctivae pink, sclerae anicteric ENT: MMM CV: Nontachycardic RESP: No audible wheezing GI: NABS, soft, obese, rounded, nontender, without rebound or guarding MSK/EXT: No significant lower extremity edema SKIN: No jaundice NEURO:  Alert & Oriented x 3, no focal deficits   REVIEW OF DATA  I reviewed the following data at the time of this encounter:  GI Procedures and Studies  January 27, 2022 colonoscopy [facility] Perianal skin tags found on perianal exam. The examined portion of the ileum was normal. 1, 25 mm polyp in the cecum.  Biopsied. 2, 6 to 8 mm polyps in  the ascending colon, removed with a cold snare.  Resected and retrieved. One 6 mm polyp in the descending colon, removed with a cold snare.  Resected and retrieved. One 5 mm polyp in the rectum, removed with a cold snare.  Resected and retrieved. Internal hemorrhoids.  Pathology Cecal biopsy- tubulovillous adenoma Large intestine ascending colon polyp-tubular adenomas Large intestine rectum/descending polyps-tubular adenoma/hyperplastic polyp  Laboratory Studies  Reviewed those in epic and care everywhere  Imaging Studies  No relevant studies to review   ASSESSMENT  Mr. Golubski is a 61 y.o. 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  The patient is seen today for evaluation and management of:  1. Cecal polyp   2. Tubulovillous adenoma of colon   3. Chronic anticoagulation    The patient is hemodynamically and clinically stable.  Based upon the description and endoscopic pictures I do feel that it is reasonable to pursue an Advanced Polypectomy attempt of the polyp/lesion.  We discussed some of the techniques of advanced polypectomy which include  Endoscopic Mucosal Resection, OVESCO Full-Thickness Resection, Endorotor Morcellation, and Tissue Ablation via Fulguration.  We also reviewed images of typical techniques as noted above.  The risks and benefits of endoscopic evaluation were discussed with the patient; these include but are not limited to the risk of perforation, infection, bleeding, missed lesions, lack of diagnosis, severe illness requiring hospitalization, as well as anesthesia and sedation related illnesses.  During attempts at advanced resection, the risks of bleeding and perforation/leak are increased as opposed to diagnostic and screening procedures, and that was discussed with the patient as well.   In addition, I explained that with the possible need for piecemeal resection, subsequent short-interval endoscopic evaluation for follow up and potential retreatment of the lesion/area may be necessary.  I did offer, a referral to surgery in order for patient to have opportunity to discuss surgical management/intervention prior to finalizing decision for attempt at endoscopic removal, however, the patient deferred on this.  If, after attempt at removal of the polyp/lesion, it is found that the patient has a complication or that an invasive lesion or malignant lesion is found, or that the polyp/lesion continues to recur, the patient is aware and understands that surgery may still be indicated/required.  All patient questions were answered, to the best of my ability, and the patient agrees to the aforementioned plan of action with follow-up as indicated.   PLAN  Procedure labs to be obtained as outlined below Proceed with scheduled colonoscopy with EMR attempt of cecal polyp Follow-up to be dictated by results at colonoscopy   Orders Placed This Encounter  Procedures   CBC with Differential/Platelet   Basic Metabolic Panel (BMET)   Protime-INR    New Prescriptions   No medications on file   Modified Medications   No medications  on file    Planned Follow Up No follow-ups on file.   Total Time in Face-to-Face and in Coordination of Care for patient including independent/personal interpretation/review of prior testing, medical history, examination, medication adjustment, communicating results with the patient directly, and documentation within the EHR is 45 minutes.   Justice Britain, MD [facility] Advanced Endoscopy Office # [phone]

## 2022-03-05 ENCOUNTER — Other Ambulatory Visit (INDEPENDENT_AMBULATORY_CARE_PROVIDER_SITE_OTHER): Payer: 59

## 2022-03-05 DIAGNOSIS — K635 Polyp of colon: Secondary | ICD-10-CM | POA: Diagnosis not present

## 2022-03-05 DIAGNOSIS — D126 Benign neoplasm of colon, unspecified: Secondary | ICD-10-CM

## 2022-03-05 DIAGNOSIS — Z7901 Long term (current) use of anticoagulants: Secondary | ICD-10-CM

## 2022-03-05 LAB — CBC WITH DIFFERENTIAL/PLATELET
Basophils Absolute: 0 10*3/uL (ref 0.0–0.1)
Basophils Relative: 0.5 % (ref 0.0–3.0)
Eosinophils Absolute: 0.1 10*3/uL (ref 0.0–0.7)
Eosinophils Relative: 2.2 % (ref 0.0–5.0)
HCT: 45.2 % (ref 39.0–52.0)
Hemoglobin: 14.9 g/dL (ref 13.0–17.0)
Lymphocytes Relative: 29.8 % (ref 12.0–46.0)
Lymphs Abs: 1.2 10*3/uL (ref 0.7–4.0)
MCHC: 32.9 g/dL (ref 30.0–36.0)
MCV: 103 fl — ABNORMAL HIGH (ref 78.0–100.0)
Monocytes Absolute: 0.4 10*3/uL (ref 0.1–1.0)
Monocytes Relative: 10.5 % (ref 3.0–12.0)
Neutro Abs: 2.2 10*3/uL (ref 1.4–7.7)
Neutrophils Relative %: 57 % (ref 43.0–77.0)
Platelets: 217 10*3/uL (ref 150.0–400.0)
RBC: 4.39 Mil/uL (ref 4.22–5.81)
RDW: 13.8 % (ref 11.5–15.5)
WBC: 3.9 10*3/uL — ABNORMAL LOW (ref 4.0–10.5)

## 2022-03-05 LAB — BASIC METABOLIC PANEL
BUN: 19 mg/dL (ref 6–23)
CO2: 20 mEq/L (ref 19–32)
Calcium: 8.9 mg/dL (ref 8.4–10.5)
Chloride: 105 mEq/L (ref 96–112)
Creatinine, Ser: 1.61 mg/dL — ABNORMAL HIGH (ref 0.40–1.50)
GFR: 46.51 mL/min — ABNORMAL LOW (ref >60.00)
Glucose, Bld: 156 mg/dL — ABNORMAL HIGH (ref 70–99)
Potassium: 3.6 mEq/L (ref 3.5–5.1)
Sodium: 135 mEq/L (ref 135–145)

## 2022-03-05 LAB — PROTIME-INR
INR: 1.3 ratio — ABNORMAL HIGH (ref 0.8–1.0)
Prothrombin Time: 14.1 s — ABNORMAL HIGH (ref 9.6–13.1)

## 2022-03-05 NOTE — Telephone Encounter (Signed)
The pt has been advised to stop his xarelto 2 days prior to his upcoming procedure.  The pt has been advised of the information and verbalized understanding.

## 2022-03-05 NOTE — Telephone Encounter (Signed)
Fax received with the ok or the pt to stop Xarelto 2 days prior to the upcoming procedure per Dr Kenton Kingfisher

## 2022-03-08 ENCOUNTER — Other Ambulatory Visit: Payer: Self-pay

## 2022-03-08 DIAGNOSIS — R718 Other abnormality of red blood cells: Secondary | ICD-10-CM

## 2022-03-17 ENCOUNTER — Encounter (HOSPITAL_COMMUNITY): Payer: Self-pay | Admitting: Gastroenterology

## 2022-03-24 ENCOUNTER — Encounter (HOSPITAL_COMMUNITY): Payer: Self-pay | Admitting: Gastroenterology

## 2022-03-24 ENCOUNTER — Ambulatory Visit (HOSPITAL_COMMUNITY)
Admission: RE | Admit: 2022-03-24 | Discharge: 2022-03-24 | Disposition: A | Discharge status: Home / Self Care | Payer: 59 | Attending: Gastroenterology | Admitting: Gastroenterology

## 2022-03-24 ENCOUNTER — Encounter (HOSPITAL_COMMUNITY)
Admission: RE | Disposition: A | Discharge status: Home / Self Care | Payer: Self-pay | Source: Home / Self Care | Attending: Gastroenterology

## 2022-03-24 ENCOUNTER — Ambulatory Visit (HOSPITAL_BASED_OUTPATIENT_CLINIC_OR_DEPARTMENT_OTHER): Payer: 59 | Admitting: Anesthesiology

## 2022-03-24 ENCOUNTER — Ambulatory Visit (HOSPITAL_COMMUNITY): Payer: 59 | Admitting: Anesthesiology

## 2022-03-24 ENCOUNTER — Other Ambulatory Visit: Payer: Self-pay

## 2022-03-24 DIAGNOSIS — K635 Polyp of colon: Secondary | ICD-10-CM | POA: Diagnosis not present

## 2022-03-24 DIAGNOSIS — Z7984 Long term (current) use of oral hypoglycemic drugs: Secondary | ICD-10-CM | POA: Diagnosis not present

## 2022-03-24 DIAGNOSIS — N189 Chronic kidney disease, unspecified: Secondary | ICD-10-CM | POA: Diagnosis not present

## 2022-03-24 DIAGNOSIS — I10 Essential (primary) hypertension: Secondary | ICD-10-CM | POA: Diagnosis not present

## 2022-03-24 DIAGNOSIS — M1990 Unspecified osteoarthritis, unspecified site: Secondary | ICD-10-CM

## 2022-03-24 DIAGNOSIS — E669 Obesity, unspecified: Secondary | ICD-10-CM | POA: Insufficient documentation

## 2022-03-24 DIAGNOSIS — Z86718 Personal history of other venous thrombosis and embolism: Secondary | ICD-10-CM | POA: Diagnosis not present

## 2022-03-24 DIAGNOSIS — E119 Type 2 diabetes mellitus without complications: Secondary | ICD-10-CM | POA: Diagnosis not present

## 2022-03-24 DIAGNOSIS — Z7901 Long term (current) use of anticoagulants: Secondary | ICD-10-CM | POA: Insufficient documentation

## 2022-03-24 DIAGNOSIS — D127 Benign neoplasm of rectosigmoid junction: Secondary | ICD-10-CM

## 2022-03-24 DIAGNOSIS — Z86010 Personal history of colonic polyps: Secondary | ICD-10-CM

## 2022-03-24 DIAGNOSIS — E785 Hyperlipidemia, unspecified: Secondary | ICD-10-CM | POA: Insufficient documentation

## 2022-03-24 DIAGNOSIS — E1122 Type 2 diabetes mellitus with diabetic chronic kidney disease: Secondary | ICD-10-CM | POA: Insufficient documentation

## 2022-03-24 DIAGNOSIS — K641 Second degree hemorrhoids: Secondary | ICD-10-CM | POA: Diagnosis present

## 2022-03-24 DIAGNOSIS — I129 Hypertensive chronic kidney disease with stage 1 through stage 4 chronic kidney disease, or unspecified chronic kidney disease: Secondary | ICD-10-CM | POA: Insufficient documentation

## 2022-03-24 DIAGNOSIS — D120 Benign neoplasm of cecum: Secondary | ICD-10-CM | POA: Diagnosis not present

## 2022-03-24 HISTORY — PX: HEMOSTASIS CLIP PLACEMENT: SHX6857

## 2022-03-24 HISTORY — PX: ENDOSCOPIC MUCOSAL RESECTION: SHX6839

## 2022-03-24 HISTORY — PX: COLONOSCOPY WITH PROPOFOL: SHX5780

## 2022-03-24 HISTORY — PX: SUBMUCOSAL LIFTING INJECTION: SHX6855

## 2022-03-24 HISTORY — PX: POLYPECTOMY: SHX5525

## 2022-03-24 LAB — GLUCOSE, CAPILLARY: Glucose-Capillary: 92 mg/dL (ref 70–99)

## 2022-03-24 SURGERY — COLONOSCOPY WITH PROPOFOL
Anesthesia: Monitor Anesthesia Care

## 2022-03-24 MED ORDER — SODIUM CHLORIDE 0.9 % IV SOLN: INTRAVENOUS | Status: DC

## 2022-03-24 MED ORDER — LACTATED RINGERS IV SOLN: INTRAVENOUS | Status: DC

## 2022-03-24 MED ORDER — PROPOFOL 10 MG/ML IV BOLUS
INTRAVENOUS | Status: DC | PRN
  Administered 2022-03-24: 100 mg via INTRAVENOUS

## 2022-03-24 MED ORDER — PROPOFOL 500 MG/50ML IV EMUL
INTRAVENOUS | Status: DC | PRN
  Administered 2022-03-24: 150 ug/kg/min via INTRAVENOUS

## 2022-03-24 MED ORDER — FENTANYL CITRATE (PF) 100 MCG/2ML IJ SOLN
INTRAMUSCULAR | Status: DC | PRN
  Administered 2022-03-24: 100 ug via INTRAVENOUS

## 2022-03-24 MED ORDER — FENTANYL CITRATE (PF) 100 MCG/2ML IJ SOLN
INTRAMUSCULAR | Status: AC
  Filled 2022-03-24: qty 2

## 2022-03-24 MED ORDER — XARELTO 20 MG PO TABS: 20.0000 mg | ORAL_TABLET | Freq: Every day | ORAL | Status: DC

## 2022-03-24 SURGICAL SUPPLY — 22 items

## 2022-03-24 NOTE — Transfer of Care (Signed)
Immediate Anesthesia Transfer of Care Note  Patient: TELLER WAKEFIELD  Procedure(s) Performed: COLONOSCOPY WITH PROPOFOL ENDOSCOPIC MUCOSAL RESECTION SUBMUCOSAL LIFTING INJECTION HEMOSTASIS CLIP PLACEMENT POLYPECTOMY  Patient Location: PACU  Anesthesia Type:MAC  Level of Consciousness: drowsy  Airway & Oxygen Therapy: Patient Spontanous Breathing and Patient connected to face mask oxygen  Post-op Assessment: Report given to RN, Post -op Vital signs reviewed and stable and Patient moving all extremities X 4  Post vital signs: Reviewed and stable  Last Vitals:  Vitals Value Taken Time  BP 129/95 03/24/22 1128  Temp    Pulse 71 03/24/22 1129  Resp 16 03/24/22 1129  SpO2 100 % 03/24/22 1129  Vitals shown include unvalidated device data.  Last Pain:  Vitals:   03/24/22 0937  TempSrc: Temporal  PainSc: 0-No pain         Complications: No notable events documented.

## 2022-03-24 NOTE — Op Note (Signed)
[facility] Patient Name: Dalton Ochoa Procedure Date: 03/24/2022 MRN: [id number] Attending MD: Justice Britain , MD Date of Birth: 1961-02-20 CSN: [id number] Age: 61 Admit Type: Outpatient Procedure:                Colonoscopy Indications:              Excision of colonic polyp Providers:                Justice Britain, MD, Burtis Junes, RN, Luan Moore, Technician Referring MD:             Otis Brace, MD Medicines:                Monitored Anesthesia Care Complications:            No immediate complications. Estimated Blood Loss:     Estimated blood loss was minimal. Procedure:                Pre-Anesthesia Assessment:                           - Prior to the procedure, a History and Physical                            was performed, and patient medications and                            allergies were reviewed. The patient's tolerance of                            previous anesthesia was also reviewed. The risks                            and benefits of the procedure and the sedation                            options and risks were discussed with the patient.                            All questions were answered, and informed consent                            was obtained. Prior Anticoagulants: The patient has                            taken Xarelto (rivaroxaban), last dose was 2 days                            prior to procedure. ASA Grade Assessment: II - A                            patient with mild systemic disease. After reviewing  the risks and benefits, the patient was deemed in                            satisfactory condition to undergo the procedure.                           After obtaining informed consent, the colonoscope                            was passed under direct vision. Throughout the                            procedure, the patient's blood pressure, pulse, and                             oxygen saturations were monitored continuously. The                            CF-HQ190L ([id number]) Olympus colonoscope was                            introduced through the anus and advanced to the 5                            cm into the ileum. The colonoscopy was technically                            difficult and complex. Successful completion of the                            procedure was aided by performing the maneuvers                            documented (below) in this report. The patient                            tolerated the procedure. The quality of the bowel                            preparation was adequate. The terminal ileum,                            ileocecal valve, appendiceal orifice, and rectum                            were photographed. Scope In: 10:22:44 AM Scope Out: 11:21:51 AM Scope Withdrawal Time: 0 hours 55 minutes 55 seconds  Total Procedure Duration: 0 hours 59 minutes 7 seconds  Findings:      The digital rectal exam findings include hemorrhoids. Pertinent       negatives include no palpable rectal lesions.      The terminal ileum and ileocecal valve appeared normal.      A 40 mm polyp was found in the cecum. The polyp was granular lateral  spreading seated deep in the cecum but away from appendiceal orifice.       Preparations were made for mucosal resection attempt. NBI imaging and       White-light endoscopy was done to demarcate the borders of the lesion.       Everlift was injected to raise the lesion. Piecemeal mucosal resection       using a snare was performed. Resection and retrieval were complete.       Coagulation for tissue destruction using snare tip soft coagulation was       successful to the margin. To prevent bleeding after mucosal resection,       five hemostatic clips were successfully placed (MR conditional) - a       mantis clipped closed nearly 50% of the lesion. There was no bleeding at       the end  of the procedure.      A 5 mm polyp was found in the recto-sigmoid colon. The polyp was       sessile. The polyp was removed with a cold snare. Resection and       retrieval were complete.      Normal mucosa was found in the entire colon otherwise.      Non-bleeding non-thrombosed internal hemorrhoids were found during       retroflexion, during perianal exam and during digital exam. The       hemorrhoids were Grade II (internal hemorrhoids that prolapse but reduce       spontaneously). Impression:               - Hemorrhoids found on digital rectal exam.                           - The examined portion of the ileum was normal.                           - One 40 mm polyp in the cecum, removed with                            piecemeal mucosal resection. Resected and                            retrieved. Treated with STSC to margin. Clips (MR                            conditional) were placed.                           - One 5 mm polyp at the recto-sigmoid colon,                            removed with a cold snare. Resected and retrieved.                           - Normal mucosa in the entire examined colon                            otherwise.                           -  Non-bleeding non-thrombosed internal hemorrhoids. Moderate Sedation:      Not Applicable - Patient had care per Anesthesia. Recommendation:           - The patient will be observed post-procedure,                            until all discharge criteria are met.                           - Discharge patient to home.                           - Patient has a contact number available for                            emergencies. The signs and symptoms of potential                            delayed complications were discussed with the                            patient. Return to normal activities tomorrow.                            Written discharge instructions were provided to the                            patient.                            - High fiber diet.                           - Use FiberCon 1-2 tablets PO daily.                           - May restart Xarelto on 8/2 AM.                           - Minimize NSAIDs for 2-weeks.                           - Continue present medications otherwise.                           - Await pathology results.                           - Repeat colonoscopy in 6-9 months for surveillance                            after piecemeal resection.                           - The findings and recommendations were discussed  with the patient.                           - The findings and recommendations were discussed                            with the patient's family. Procedure Code(s):        --- Professional ---                           [phone], Colonoscopy, flexible; with endoscopic                            mucosal resection                           [id number], 26, Colonoscopy, flexible; with removal of                            tumor(s), polyp(s), or other lesion(s) by snare                            technique Diagnosis Code(s):        --- Professional ---                           K64.1, Second degree hemorrhoids                           K63.5, Polyp of colon CPT copyright 2019 [employer]. All rights reserved. The codes documented in this report are preliminary and upon coder review may  be revised to meet current compliance requirements. Justice Britain, MD 03/24/2022 11:36:22 AM Number of Addenda: 0

## 2022-03-24 NOTE — Anesthesia Preprocedure Evaluation (Signed)
Anesthesia Evaluation  Patient identified by MRN, date of birth, ID band Patient awake    Reviewed: Allergy & Precautions, NPO status , Patient's Chart, lab work & pertinent test results  Airway Mallampati: III  TM Distance: >3 FB Neck ROM: Full    Dental  (+) Dental Advisory Given   Pulmonary neg pulmonary ROS,    breath sounds clear to auscultation       Cardiovascular hypertension, Pt. on medications and Pt. on home beta blockers + DVT   Rhythm:Regular Rate:Normal     Neuro/Psych negative neurological ROS     GI/Hepatic negative GI ROS, Neg liver ROS,   Endo/Other  diabetes, Type 2, Oral Hypoglycemic Agents  Renal/GU Renal disease     Musculoskeletal  (+) Arthritis ,   Abdominal   Peds  Hematology  (+) Blood dyscrasia, ,   Anesthesia Other Findings   Reproductive/Obstetrics                             Anesthesia Physical Anesthesia Plan  ASA: 3  Anesthesia Plan: MAC   Post-op Pain Management: Minimal or no pain anticipated   Induction:   PONV Risk Score and Plan: 1 and Propofol infusion and Ondansetron  Airway Management Planned: Natural Airway and Simple Face Mask  Additional Equipment:   Intra-op Plan:   Post-operative Plan:   Informed Consent: I have reviewed the patients History and Physical, chart, labs and discussed the procedure including the risks, benefits and alternatives for the proposed anesthesia with the patient or authorized representative who has indicated his/her understanding and acceptance.       Plan Discussed with:   Anesthesia Plan Comments:         Anesthesia Quick Evaluation

## 2022-03-24 NOTE — Interval H&P Note (Signed)
History and Physical Interval Note:  03/24/2022 9:45 AM  Dalton Ochoa  has presented today for surgery, with the diagnosis of colon polyp.  The various methods of treatment have been discussed with the patient and family. After consideration of risks, benefits and other options for treatment, the patient has consented to  Procedure(s): COLONOSCOPY WITH PROPOFOL (N/A) ENDOSCOPIC MUCOSAL RESECTION (N/A) as a surgical intervention.  The patient's history has been reviewed, patient examined, no change in status, stable for surgery.  I have reviewed the patient's chart and labs.  Questions were answered to the patient's satisfaction.     [employer]

## 2022-03-24 NOTE — Discharge Instructions (Signed)

## 2022-03-25 ENCOUNTER — Encounter: Payer: Self-pay | Admitting: Gastroenterology

## 2022-03-25 LAB — SURGICAL PATHOLOGY

## 2022-03-25 NOTE — Anesthesia Postprocedure Evaluation (Signed)
Anesthesia Post Note  Patient: CARMINO OCAIN  Procedure(s) Performed: COLONOSCOPY WITH PROPOFOL ENDOSCOPIC MUCOSAL RESECTION SUBMUCOSAL LIFTING INJECTION HEMOSTASIS CLIP PLACEMENT POLYPECTOMY     Patient location during evaluation: PACU Anesthesia Type: MAC Level of consciousness: awake and alert Pain management: pain level controlled Vital Signs Assessment: post-procedure vital signs reviewed and stable Respiratory status: spontaneous breathing, nonlabored ventilation, respiratory function stable and patient connected to nasal cannula oxygen Cardiovascular status: stable and blood pressure returned to baseline Postop Assessment: no apparent nausea or vomiting Anesthetic complications: no   No notable events documented.  Last Vitals:  Vitals:   03/24/22 1150 03/24/22 1200  BP: (!) 154/103 (!) 153/88  Pulse: 65 61  Resp: 12 16  Temp:    SpO2: 98% 100%    Last Pain:  Vitals:   03/24/22 1200  TempSrc:   PainSc: 1                  Tiajuana Amass

## 2022-03-27 ENCOUNTER — Encounter (HOSPITAL_COMMUNITY): Payer: Self-pay | Admitting: Gastroenterology

## 2022-05-11 ENCOUNTER — Other Ambulatory Visit (HOSPITAL_BASED_OUTPATIENT_CLINIC_OR_DEPARTMENT_OTHER): Payer: Self-pay

## 2022-05-11 MED ORDER — COMIRNATY 30 MCG/0.3ML IM SUSY
PREFILLED_SYRINGE | INTRAMUSCULAR | 0 refills | Status: AC
  Filled 2022-05-11: qty 0.3, 1d supply, fill #0

## 2022-08-16 ENCOUNTER — Other Ambulatory Visit: Payer: Self-pay

## 2022-08-19 ENCOUNTER — Other Ambulatory Visit: Payer: Self-pay

## 2022-08-19 MED ORDER — TOPIRAMATE 100 MG PO TABS: 100.0000 mg | ORAL_TABLET | Freq: Every day | ORAL | 0 refills | Status: DC

## 2022-09-01 ENCOUNTER — Other Ambulatory Visit: Payer: Self-pay

## 2022-09-01 MED ORDER — TOPIRAMATE 100 MG PO TABS: 100.0000 mg | ORAL_TABLET | Freq: Every day | ORAL | 0 refills | Status: DC

## 2022-09-09 ENCOUNTER — Ambulatory Visit: Payer: 59 | Admitting: Neurology

## 2022-09-09 ENCOUNTER — Encounter: Payer: Self-pay | Admitting: Neurology

## 2022-09-09 VITALS — BP 119/72 | HR 60 | Ht 70.0 in | Wt 241.8 lb

## 2022-09-09 DIAGNOSIS — G250 Essential tremor: Secondary | ICD-10-CM | POA: Diagnosis not present

## 2022-09-09 DIAGNOSIS — G479 Sleep disorder, unspecified: Secondary | ICD-10-CM | POA: Diagnosis not present

## 2022-09-09 DIAGNOSIS — G4733 Obstructive sleep apnea (adult) (pediatric): Secondary | ICD-10-CM | POA: Diagnosis not present

## 2022-09-09 DIAGNOSIS — G4726 Circadian rhythm sleep disorder, shift work type: Secondary | ICD-10-CM | POA: Diagnosis not present

## 2022-09-09 DIAGNOSIS — E669 Obesity, unspecified: Secondary | ICD-10-CM

## 2022-09-09 MED ORDER — TOPIRAMATE 100 MG PO TABS: 100.0000 mg | ORAL_TABLET | Freq: Every day | ORAL | 0 refills | Status: DC

## 2022-09-09 MED ORDER — TOPIRAMATE 100 MG PO TABS: 100.0000 mg | ORAL_TABLET | Freq: Every day | ORAL | 3 refills | Status: DC

## 2022-09-09 NOTE — Patient Instructions (Signed)
It was nice to meet you today.   As discussed, we will proceed with a home sleep test (HST) to re-establish your sleep apnea diagnosis and to get you a new machine. Our sleep lab staff will reach out to you to arrange for pickup and for tutorial of your test equipment - you will do the test at home that night and bring the test sensors back for data analysis the next day or whenever you are scheduled for drop off of your test equipment. I will write for a new machine after your HST confirms your obstructive sleep apnea diagnosis.   Please continue with Topamax generic 100 mg once daily for your tremors.  He can follow-up routinely for tremor and checkup in 1 year, we will arrange for a follow-up separately for sleep apnea if the need arises.  All

## 2022-09-09 NOTE — Progress Notes (Signed)
Subjective:    Patient ID: Dalton Ochoa is a 62 y.o. male.  HPI   Star Age, MD, PhD [facility] [address] [place], [place] [address]  Mr. [name] is a 62 year old male with an underlying medical history of diabetes, hypertension, hyperlipidemia, history of DVT, PE, on Xarelto, Raynaud's disease, venous stasis, gout, SVT with status post ablation in June 2020, and obesity, who presents for follow-up consultation of his essential tremor of many years duration.  The patient is unaccompanied today.  And presents for his 1 year checkup.  This is our first visit, he has previously been followed by Dr. Margette Fast who retired and nurse practitioner Butler Denmark.  He was last seen in this clinic by Butler Denmark, NP on 09/09/2021, at which time he felt stable on Topamax.  He had transition from Mysoline to Topamax secondary to an interaction between the Mysoline and Xarelto.  He had initially seen Dr. Jannifer Franklin for a right hand tremor on 09/09/2016.  Today, 09/09/2022: He reports doing fairly well with the topiramate.  He takes it first thing in the morning as he works nights.  He works from 7 PM to 7 AM.  He may be in bed somewhere between 11 AM and noon.  He has not noticed any side effects from the topiramate.  He takes 100 mg in the mornings.  He does not sleep well.  He sleeps only till 4 or 4:30 PM and does endorse a history of obstructive sleep apnea.  He was diagnosed in the 90s as he recalls and has not used a CPAP machine in years, he reports that he threw it away.  He would be willing to get reevaluated with a home sleep test.  He inquired about inspire and I explained the inclusion criteria to him and the treatment option.  He is encouraged to get evaluated and possibly consider an AutoPap machine.  His mom had a tremor, mostly around her mouth.  He denies any daily caffeine use.  He drinks alcohol occasionally, on his days off.  He tries to  hydrate well with water and Gatorade.  He has not noticed any change in his taste especially when drinking soda.  He may drink a soda as needed.  He has low back pain and takes Lyrica per [facility].  He had a tonsillectomy as a child.  Previously (copied from previous notes for reference):    << 09/09/21 (SS): Daquarius here today for follow-up with history of tremor in the right hand.  Last year he was switched from Mysoline to Topamax due to potential interaction between Mysoline and Xarelto.  Has done well with Topamax.  Denies side effect.  Tremor remains stable in the right hand, with holding an object, or handwriting.  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  He works night shift. Looking forward to [place] trip.    Update 09/09/2020 SS: Mr. Parekh is a 62 year old male with history of tremor affecting his right hand.  He remains on Mysoline 50 mg at bedtime.  MRI of the brain has shown minimal white matter changes in the past.  Feels the tremor to the right hand has gradually worsened slowly over time.  Notices the tremor when the hand is turned in motion, such as pouring a drink.  No trouble eating.  He works as a Glass blower/designer, has to  record a log every 30 minutes, writing numbers, he has the most difficulty with this, especially "8", he will often substitute another number that is in range.  He works night shift.  Tolerates Mysoline, feels the medication is helpful. Has cut back on caffeine, has helped tremor. Denies any other symptoms. Is working out, reports 8 lb weight loss. His mother had a jaw tremor.  He is on Xarelto, reports has been taking for years.  Here today for evaluation unaccompanied.   HISTORY  09/10/2019 SS: Mr. Blankenbiller is a 62 year old male with history of tremor affecting his right hand.  The tremor worsens when he is upset.  He remains on Mysoline.  MRI of the brain has shown minimal white  matter changes in the past.  His tremor is under good control, does not affect his handwriting or eating.  He works as a Glass blower/designer.  He is tolerating Mysoline without side effect.  Since last seen, says he had SVT, had cardiac ablation, has not had further problems.  He presents today for evaluation unaccompanied. >>   His Past Medical History Is Significant For: Past Medical History:  Diagnosis Date   Diabetes mellitus without complication ([place])    DVT (deep venous thrombosis) ([facility])    Gout    Hyperlipidemia    Hypertension    Inguinal hernia    Occasional tremors    Right hand   Pulmonary emboli ([place]) 07/21/2005   Raynaud's disease    Varicose veins    Venous stasis     His Past Surgical History Is Significant For: Past Surgical History:  Procedure Laterality Date   COLONOSCOPY WITH PROPOFOL N/A 03/24/2022   Procedure: COLONOSCOPY WITH PROPOFOL;  Surgeon: Irving Copas., MD;  Location: [facility];  Service: Gastroenterology;  Laterality: N/A;   ENDOSCOPIC MUCOSAL RESECTION N/A 03/24/2022   Procedure: ENDOSCOPIC MUCOSAL RESECTION;  Surgeon: Rush Landmark Telford Nab., MD;  Location: [facility];  Service: Gastroenterology;  Laterality: N/A;   ENDOVENOUS ABLATION SAPHENOUS VEIN W/ LASER Right 10/10/2014   EVLA right greater saphenous vein by Curt Jews MD   ENDOVENOUS ABLATION SAPHENOUS VEIN W/ LASER Left 12/05/2014   endovenous laser ablation left small saphenous vein by Curt Jews MD   HEMOSTASIS CLIP PLACEMENT  03/24/2022   Procedure: HEMOSTASIS CLIP PLACEMENT;  Surgeon: Irving Copas., MD;  Location: [facility];  Service: Gastroenterology;;   INGUINAL HERNIA REPAIR Right    NESBIT PROCEDURE N/A 09/02/2017   Procedure: NESBIT PROCEDURE AND BLOCK;  Surgeon: [name], MD;  Location: [facility];  Service: Urology;  Laterality: N/A;   POLYPECTOMY  03/24/2022   Procedure: POLYPECTOMY;  Surgeon: Rush Landmark Telford Nab., MD;  Location: [facility];  Service: Gastroenterology;;   Maryagnes Amos INJECTION  03/24/2022   Procedure: SUBMUCOSAL LIFTING INJECTION;  Surgeon: Irving Copas., MD;  Location: [facility];  Service: Gastroenterology;;   SVT ABLATION N/A 01/17/2019   Procedure: SVT ABLATION;  Surgeon: Constance Haw, MD;  Location: [facility];  Service: Cardiovascular;  Laterality: N/A;   TESTICLE REMOVAL Right    TONSILLECTOMY      His Family History Is Significant For: Family History  Problem Relation Age of Onset   Hypertension Mother    Diabetes Father    Deep vein thrombosis Father    Dilated cardiomyopathy Brother    Heart disease Brother    Diabetes Paternal Grandmother    Heart disease Daughter    Colon cancer Neg Hx  Esophageal cancer Neg Hx    Inflammatory bowel disease Neg Hx    Liver disease Neg Hx    Pancreatic cancer Neg Hx    Rectal cancer Neg Hx    Stomach cancer Neg Hx     His Social History Is Significant For: Social History   Socioeconomic History   Marital status: Married    Spouse name: Lilyan Punt   Number of children: 2   Years of education: 14   Highest education level: Not on file  Occupational History   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  Tobacco Use   Smoking status: Never   Smokeless tobacco: Never  Vaping Use   Vaping Use: Never used  Substance and Sexual Activity   Alcohol use: Yes    Comment: 1 beer every 3 days   Drug use: No   Sexual activity: Not on file  Other Topics Concern   Not on file  Social History Narrative   Lives with wife and two daughters   Caffeine use: 2 sodas per month   Right handed    Social Determinants of Health   Financial Resource Strain: Not on file  Food Insecurity: Not on file  Transportation Needs: Not on file  Physical Activity: Not on file  Stress: Not on file  Social Connections: Not on file    His Allergies Are:  Allergies  Allergen Reactions   Tramadol Hcl Rash  :   His Current Medications Are:   Outpatient Encounter Medications as of 09/09/2022  Medication Sig   allopurinol (ZYLOPRIM) 100 MG tablet Take 1 tablet by mouth 2 (two) times daily.   benazepril (LOTENSIN) 40 MG tablet Take 40 mg by mouth daily in the afternoon.   buPROPion (WELLBUTRIN XL) 300 MG 24 hr tablet Take 300 mg by mouth daily.   COVID-19 mRNA vaccine 2023-2024 (COMIRNATY) syringe Inject into the muscle.   furosemide (LASIX) 20 MG tablet Take 20 mg by mouth daily.   hydroxychloroquine (PLAQUENIL) 200 MG tablet Take 200 mg by mouth 2 (two) times daily.   ipratropium (ATROVENT) 0.06 % nasal spray Place 1 spray into both nostrils 2 (two) times daily as needed for rhinitis.   metFORMIN (GLUCOPHAGE) 500 MG tablet Take 500 mg by mouth daily.   metoprolol succinate (TOPROL-XL) 50 MG 24 hr tablet Take 50 mg by mouth daily.   Potassium 99 MG TABS Take 99 mg by mouth daily.   pravastatin (PRAVACHOL) 20 MG tablet Take 20 mg by mouth daily.    pregabalin (LYRICA) 75 MG capsule Take 75 mg by mouth 2 (two) times daily as needed (pain).   tadalafil (CIALIS) 20 MG tablet Take 20 mg by mouth daily as needed.   topiramate (TOPAMAX) 100 MG tablet Take 1 tablet (100 mg total) by mouth at bedtime.   XARELTO 20 MG TABS tablet Take 1 tablet (20 mg total) by mouth daily.   No facility-administered encounter medications on file as of 09/09/2022.  :   Review of Systems:  Out of a complete 14 point review of systems, all are reviewed and negative with the exception of these symptoms as listed below:  Review of Systems  Neurological:        Follow up on R hand essential tremor. Topamax 123m is beeter,  Still more challenging when working with tools, pouring drinks, and writing.       Objective:  Neurological Exam  Physical Exam Physical Examination:   Vitals:   09/09/22 0956  BP: 119/72  Pulse: 60  General Examination: The patient is a very pleasant 62 y.o. male in no acute distress. He appears well-developed and  well-nourished and well groomed.   HEENT: Normocephalic, atraumatic, pupils are equal, round and reactive to light, extraocular tracking is good without limitation to gaze excursion or nystagmus noted. Hearing is grossly intact. Face is symmetric with normal facial animation. Speech is clear with no dysarthria noted. There is no hypophonia. There is no lip, neck/head, jaw or voice tremor. Neck is supple with full range of passive and active motion. There are no carotid bruits on auscultation. Oropharynx exam reveals: mild mouth dryness, adequate dental hygiene with partial dentures top and bottom, significant airway crowding secondary to Mallampati class IV, uvula not fully visualized but appears to be wider, tongue large.  Tongue protrudes centrally and palate elevates symmetrically.  Tip of uvula not visualized.    Chest: Clear to auscultation without wheezing, rhonchi or crackles noted.  Heart: S1+S2+0, regular and normal without murmurs, rubs or gallops noted.   Abdomen: Soft, non-tender and non-distended.  Extremities: There is no pitting edema in the distal lower extremities bilaterally.   Skin: Warm and dry without trophic changes noted.   Musculoskeletal: exam reveals no obvious joint deformities.   Neurologically:  Mental status: The patient is awake, alert and oriented in all 4 spheres. His immediate and remote memory, attention, language skills and fund of knowledge are appropriate. There is no evidence of aphasia, agnosia, apraxia or anomia. Speech is clear with normal prosody and enunciation. Thought process is linear. Mood is normal and affect is normal.  Cranial nerves II - XII are as described above under HEENT exam.  Motor exam: Normal bulk, strength and tone is noted. There is no resting tremor, he has a minimal action tremor and minimal bilateral postural tremor.  No lower extremity tremor.   Fine motor skills and coordination: grossly intact.  Cerebellar testing: No dysmetria  or intention tremor. There is no truncal or gait ataxia.  Sensory exam: intact to light touch in the upper and lower extremities.  Gait, station and balance: He stands easily. No veering to one side is noted. No leaning to one side is noted. Posture is age-appropriate and stance is narrow based. Gait shows normal stride length and normal pace. No problems turning are noted.   Assessment and Plan:   In summary, TYREI VASILIOU is a very pleasant 62 y.o.-year old male with an underlying medical history of diabetes, hypertension, hyperlipidemia, obstructive sleep apnea (no longer on PAP therapy), history of DVT, PE, on Xarelto, Raynaud's disease, venous stasis, gout, SVT with status post ablation in June 2020, and obesity, who presents for follow-up consultation of his longstanding history of hand tremors.  He has been on topiramate 100 mg daily, takes it before going to sleep, he works nights.  We were talking about tremor triggers today and alleviating factors.  He is encouraged to stay well-hydrated and well rested and avoid caffeine in excess.  He has a history of obstructive sleep apnea and has not been treated or tested in many years.  He is agreeable to pursue reevaluation with a home sleep test.  I explained this test to him in detail.  He is advised that first-line therapy for sleep apnea is still positive airway pressure therapy such as AutoPap or CPAP.  We talked about inspire as well today.  He would be willing to consider restarting PAP therapy if the need arises.  For his tremor, he is advised to  continue with topiramate at the current dose.  He is advised to follow-up routinely with Butler Denmark, NP in 1 year and we will arrange for a home sleep test in the interim and also a separate follow-up for sleep apnea if the need arises.  I answered all his questions today and he was in agreement.   I spent 40 minutes in total face-to-face time and in reviewing records during pre-charting, more than 50%  of which was spent in counseling and coordination of care, reviewing test results, reviewing medications and treatment regimen and/or in discussing or reviewing the diagnosis of ET, OSA, the prognosis and treatment options. Pertinent laboratory and imaging test results that were available during this visit with the patient were reviewed by me and considered in my medical decision making (see chart for details).

## 2022-09-13 ENCOUNTER — Telehealth: Payer: Self-pay | Admitting: Neurology

## 2022-09-13 NOTE — Telephone Encounter (Signed)
09/09/22 [employer] No auth req EE  09/13/22 left VM KS

## 2022-09-14 NOTE — Telephone Encounter (Signed)
HST- [employer] no auth req   Patient is scheduled at [facility] for 10/20/22 at 9 AM.  Mailed packet to the patient.

## 2022-10-20 ENCOUNTER — Ambulatory Visit: Payer: 59 | Admitting: Neurology

## 2022-10-20 DIAGNOSIS — G4733 Obstructive sleep apnea (adult) (pediatric): Secondary | ICD-10-CM | POA: Diagnosis not present

## 2022-10-20 DIAGNOSIS — G250 Essential tremor: Secondary | ICD-10-CM

## 2022-10-20 DIAGNOSIS — E669 Obesity, unspecified: Secondary | ICD-10-CM

## 2022-10-20 DIAGNOSIS — G4726 Circadian rhythm sleep disorder, shift work type: Secondary | ICD-10-CM

## 2022-10-20 DIAGNOSIS — G479 Sleep disorder, unspecified: Secondary | ICD-10-CM

## 2022-10-21 NOTE — Progress Notes (Signed)
See procedure note.

## 2022-10-25 NOTE — Procedures (Signed)
Dalton Ochoa  HOME SLEEP TEST (Watch PAT) REPORT  STUDY DATE: 10/20/2022  DOB: [birth year]  MRN: [id number]  ORDERING CLINICIAN: Star Age, MD, PhD  CLINICAL INFORMATION/HISTORY: 62 year old male with an underlying medical history of diabetes, hypertension, hyperlipidemia, history of DVT, PE, on Xarelto, Raynaud's disease, venous stasis, gout, SVT with status post ablation in June 2020, tremors, and obesity, who presents for evaluation of his obstructive sleep apnea.  He was diagnosed many years ago and does not currently have a PAP machine.    BMI: 34.7 kg/m  FINDINGS:   Sleep Summary:   Total Recording Time (hours, min): 8 hours, 19 min  Total Sleep Time (hours, min):  7 hours, 29 min  Percent REM (%):    25.2%   Respiratory Indices:   Calculated pAHI (per hour):  8.5/hour         REM pAHI:    17.9/hour       NREM pAHI: 5.9/hour  Central pAHI: 0.2/hour  Oxygen Saturation Statistics:    Oxygen Saturation (%) Mean: 96%   Minimum oxygen saturation (%):                 78%   O2 Saturation Range (%): 78-100%    O2 Saturation (minutes) <=88%: 0.3 min  Pulse Rate Statistics:   Pulse Mean (bpm):    55/min    Pulse Range (35 - 101/min)   IMPRESSION: OSA (obstructive sleep apnea)   RECOMMENDATION:  This home sleep test demonstrates overall mild obstructive sleep apnea with a total AHI of 8.5/hour and O2 nadir of 78%. Snoring was detected, and appeared to be intermittent in the mild to moderate range, rarely louder. Given the patient's medical history and sleep related complaints, therapy with a  positive airway pressure device is a reasonable first-line choice and clinically recommended. Treatment can be achieved in the form of autoPAP trial/titration at home for now. A full night, in-lab PAP titration study may aid in improving proper treatment settings and with mask fit, if needed, down the road. Alternative treatments may include weight loss  (where appropriate) along with avoidance of the supine sleep position (if possible), or an oral appliance in appropriate candidates.   Please note that untreated obstructive sleep apnea may carry additional perioperative morbidity. Patients with significant obstructive sleep apnea should receive perioperative PAP therapy and the surgeons and particularly the anesthesiologist should be informed of the diagnosis and the severity of the sleep disordered breathing. The patient should be cautioned not to drive, work at heights, or operate dangerous or heavy equipment when tired or sleepy. Review and reiteration of good sleep hygiene measures should be pursued with any patient. Other causes of the patient's symptoms, including circadian rhythm disturbances, an underlying mood disorder, medication effect and/or an underlying medical problem cannot be ruled out based on this test. Clinical correlation is recommended.  The patient and his referring provider will be notified of the test results. The patient will be seen in follow up in sleep clinic at [facility], as necessary.  I certify that I have reviewed the raw data recording prior to the issuance of this report in accordance with the standards of the American Academy of Sleep Medicine (AASM).    INTERPRETING PHYSICIAN:   Star Age, MD, PhD Medical Director, [facility]) [place], ABPN (Neurology and Sleep)   [facility] [address], [place], [place] [address] [phone]

## 2022-10-28 ENCOUNTER — Telehealth: Payer: Self-pay | Admitting: *Deleted

## 2022-10-28 DIAGNOSIS — G4733 Obstructive sleep apnea (adult) (pediatric): Secondary | ICD-10-CM

## 2022-10-28 NOTE — Telephone Encounter (Signed)
We will set patient up with autoPAP at home. Pls process order and notify patient and set up FU in 10 weeks with me or NP.      

## 2022-10-28 NOTE — Telephone Encounter (Signed)
Referral faxed to [facility]. Received a receipt of confirmation.

## 2022-10-28 NOTE — Telephone Encounter (Signed)
-----   Message from Star Age, MD sent at 10/25/2022  4:48 PM EDT ----- Patient had a recent follow-up appointment for his tremors on 09/09/2022, at which time we talked about his previous diagnosis of sleep apnea as well.  He had a home sleep test on 10/20/2022.  Please advise patient that his sleep test at home showed evidence of mild sleep apnea, overall AHI was 8.5/h but he did have a couple of oxygen desaturations into the 80s and higher 70s with a low oxygen saturation of 78% for the night.  He had intermittent mild to moderate snoring for the most part.  If he would like to get on treatment with an AutoPap machine, that would be a reasonable choice and I would be happy to prescribe a machine, alternative treatment options include weight loss and avoiding the supine sleep position as well as the possibility of using a dental device, although the dental device option is more challenging when you have dentures or partials.  If he would like to get a referral to dentistry, we can certainly facilitate.  Let me know how he would like to proceed, again, I would be happy to prescribe an AutoPap machine.  We had also talked about inspire but based on these test results he does not qualify for inspire treatment as it is only approved for moderate and severe sleep apnea patients who have tried CPAP therapy first.

## 2022-10-28 NOTE — Telephone Encounter (Signed)
Spoke with patient and discussed his sleep study results. Patient is amenable to proceeding with autopap if covered by insurance.  He did not have a preference for DME company.  We will refer to [facility] after we obtain the order from Dr Rexene Alberts.  The patient verbalized understanding of the insurance compliance requirements which includes using the machine at least 4 hours at night and also being seen in our office between 30 and 90 days after set up.  Will hold on scheduling that follow-up at this time pending machine set up.  I let the patient know that if he does not end up being set up on autopap to please let us know and also let us know if he decides what alternative treatment he would like.  The patient verbalized understanding and appreciation.

## 2022-10-28 NOTE — Addendum Note (Signed)
Addended by: Star Age on: 10/28/2022 05:36 PM   Modules accepted: Orders

## 2022-11-30 ENCOUNTER — Telehealth: Payer: Self-pay | Admitting: Gastroenterology

## 2022-11-30 ENCOUNTER — Other Ambulatory Visit: Payer: Self-pay

## 2022-11-30 DIAGNOSIS — K635 Polyp of colon: Secondary | ICD-10-CM

## 2022-11-30 MED ORDER — NA SULFATE-K SULFATE-MG SULF 17.5-3.13-1.6 GM/177ML PO SOLN: 1.0000 | Freq: Once | ORAL | 0 refills | Status: AC

## 2022-11-30 NOTE — Telephone Encounter (Signed)
Colon has been scheduled for 02/03/23 at 1215 pm at [facility] with GM  Left message on machine to call back

## 2022-11-30 NOTE — Telephone Encounter (Signed)
Patient called to schedule next colonoscopy he is due for. On recall it states that it will be done at [facility]. Requesting a call back so he can be scheduled. Please advise, thank you.

## 2022-12-01 MED ORDER — NA SULFATE-K SULFATE-MG SULF 17.5-3.13-1.6 GM/177ML PO SOLN: 1.0000 | Freq: Once | ORAL | 0 refills | Status: AC

## 2022-12-01 NOTE — Telephone Encounter (Signed)
Colon scheduled, pt instructed and medications reviewed.  Patient instructions mailed to home.  Patient to call with any questions or concerns.  

## 2022-12-01 NOTE — Telephone Encounter (Signed)
Left message on machine to call back  

## 2023-01-13 NOTE — Progress Notes (Signed)
Cardiology Clinic Note   Patient Name: Dalton Ochoa Date of Encounter: 01/18/2023  Primary Care Provider:  Noberto Retort, Ochoa Primary Cardiologist:  Dalton Millers, Ochoa  Patient Profile    62 year old male with h/o SVT, and RBBB, SLE, OSA, HTN. Echocardiogram June 2020 showed normal LV function, severe left ventricular hypertrophy, severe left atrial enlargement.  There was concern about hypertrophic cardiomyopathy.  Nuclear study June 2020 showed ejection fraction 48% but no ischemia. 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  PYP scan July 2020 not suggestive of amyloid.  Patient seen by Dalton Ochoa in follow-up July 2020.  Last seen by Dalton Ochoa on 12/16/2021.   Past Medical History    Past Medical History:  Diagnosis Date   Diabetes mellitus without complication (HCC)    DVT (deep venous thrombosis) (HCC)    Gout    Hyperlipidemia    Hypertension    Inguinal hernia    Occasional tremors    Right hand   Pulmonary emboli (HCC) 07/21/2005   Raynaud's disease    Varicose veins    Venous stasis    Past Surgical History:  Procedure Laterality Date   COLONOSCOPY WITH PROPOFOL N/A 03/24/2022   Procedure: COLONOSCOPY WITH PROPOFOL;  Surgeon: Dalton Ochoa., Ochoa;  Location: [facility];  Service: Gastroenterology;  Laterality: N/A;   ENDOSCOPIC MUCOSAL RESECTION N/A 03/24/2022   Procedure: ENDOSCOPIC MUCOSAL RESECTION;  Surgeon: Dalton Ochoa., Ochoa;  Location: [facility];  Service: Gastroenterology;  Laterality: N/A;   ENDOVENOUS ABLATION SAPHENOUS VEIN W/ LASER Right 10/10/2014   EVLA right greater saphenous vein by Dalton Ochoa   ENDOVENOUS ABLATION SAPHENOUS VEIN W/ LASER Left 12/05/2014   endovenous laser ablation left small saphenous vein by Dalton Ochoa   HEMOSTASIS CLIP PLACEMENT  03/24/2022   Procedure: HEMOSTASIS CLIP PLACEMENT;  Surgeon: Dalton Ochoa., Ochoa;  Location: [facility];  Service: Gastroenterology;;   INGUINAL HERNIA REPAIR  Right    NESBIT PROCEDURE N/A 09/02/2017   Procedure: NESBIT PROCEDURE AND BLOCK;  Surgeon: Dalton Ache, Ochoa;  Location: [facility];  Service: Urology;  Laterality: N/A;   POLYPECTOMY  03/24/2022   Procedure: POLYPECTOMY;  Surgeon: Dalton Ochoa., Ochoa;  Location: [facility];  Service: Gastroenterology;;   Brooke Dare INJECTION  03/24/2022   Procedure: SUBMUCOSAL LIFTING INJECTION;  Surgeon: Dalton Ochoa., Ochoa;  Location: [facility];  Service: Gastroenterology;;   SVT ABLATION N/A 01/17/2019   Procedure: SVT ABLATION;  Surgeon: Dalton Lemming, Ochoa;  Location: [facility];  Service: Cardiovascular;  Laterality: N/A;   TESTICLE REMOVAL Right    TONSILLECTOMY      Allergies  Allergies  Allergen Reactions   Tramadol Hcl Rash    History of Present Illness    Mr. Dalton Ochoa comes today for ongoing assessment and management of PSVT, hypertension, hypercholesterolemia.  The patient did have 1 episode of rapid heart rhythm July 20, 2022 after drinking alcohol.  He states his heart rate went up to 127 bpm.  He drank some water took some deep breaths and heart rate eventually came down.  He states he did stop taking zinc and potassium shortly thereafter and has not had any recurrence of symptoms.    His medication list showed that he had was on carvedilol 2.5 mg twice daily and metoprolol XL 50 mg daily.  On review of his medication list with the patient he is no longer on carvedilol.  Also has a history of DVT,  and remains on Xarelto without complaints of bleeding or hemoptysis.  The patient has been medically compliant, has follow-up labs by his PCP every 6 months Dalton Ochoa.  Prescription refilled through primary care.  He is recently started on CPAP machine for newly diagnosed OSA.  He states he is getting used to it.  He works 12-hour nights on different intervals throughout the week and is beginning to get used to the CPAP and hopefully  improve sleep hygiene.  Home Medications    Current Outpatient Medications  Medication Sig Dispense Refill   allopurinol (ZYLOPRIM) 100 MG tablet Take 1 tablet by mouth 2 (two) times daily.     benazepril (LOTENSIN) 40 MG tablet Take 40 mg by mouth daily in the afternoon.     buPROPion (WELLBUTRIN XL) 300 MG 24 hr tablet Take 300 mg by mouth daily.     colchicine (COLCRYS) 0.6 MG tablet Take 0.6 mg by mouth daily as needed (gout).     COVID-19 mRNA vaccine 2023-2024 (COMIRNATY) syringe Inject into the muscle. 0.3 mL 0   furosemide (LASIX) 20 MG tablet Take 20 mg by mouth daily.     hydroxychloroquine (PLAQUENIL) 200 MG tablet Take 200 mg by mouth 2 (two) times daily.     ipratropium (ATROVENT) 0.06 % nasal spray Place 1 spray into both nostrils 2 (two) times daily as needed for rhinitis.     metFORMIN (GLUCOPHAGE) 500 MG tablet Take 500 mg by mouth daily.     metoprolol succinate (TOPROL-XL) 50 MG 24 hr tablet Take 50 mg by mouth daily.     pravastatin (PRAVACHOL) 20 MG tablet Take 20 mg by mouth daily.      pregabalin (LYRICA) 75 MG capsule Take 75 mg by mouth 2 (two) times daily as needed (pain).     tadalafil (CIALIS) 20 MG tablet Take 20 mg by mouth daily as needed.     topiramate (TOPAMAX) 100 MG tablet Take 1 tablet (100 mg total) by mouth at bedtime. 90 tablet 3   XARELTO 20 MG TABS tablet Take 1 tablet (20 mg total) by mouth daily. 30 tablet    No current facility-administered medications for this visit.     Family History    Family History  Problem Relation Age of Onset   Hypertension Mother    Diabetes Father    Deep vein thrombosis Father    Dilated cardiomyopathy Brother    Heart disease Brother    Diabetes Paternal Grandmother    Heart disease Daughter    Colon cancer Neg Hx    Esophageal cancer Neg Hx    Inflammatory bowel disease Neg Hx    Liver disease Neg Hx    Pancreatic cancer Neg Hx    Rectal cancer Neg Hx    Stomach cancer Neg Hx    He indicated that  his mother is alive. He indicated that his father is deceased. He indicated that his brother is alive. He indicated that his maternal grandmother is deceased. He indicated that his maternal grandfather is deceased. He indicated that his paternal grandmother is deceased. He indicated that his paternal grandfather is deceased. He indicated that both of his daughters are alive. He indicated that the status of his neg hx is unknown.  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  Tobacco Use   Smoking status: Never   Smokeless tobacco: Never  Vaping Use   Vaping Use: Never used  Substance and Sexual Activity   Alcohol use: Yes    Comment: 1 beer every 3 days   Drug use: No   Sexual activity: Not on file  Other Topics Concern   Not on file  Social History Narrative   Lives with wife and two daughters   Caffeine use: 2 sodas per month   Right handed    Social Determinants of Health   Financial Resource Strain: Not on file  Food Insecurity: Not on file  Transportation Needs: Not on file  Physical Activity: Not on file  Stress: Not on file  Social Connections: Not on file  Intimate Partner Violence: Not on file     Review of Systems    General:  No chills, fever, night sweats or weight changes.  Cardiovascular:  No chest pain, dyspnea on exertion, edema, orthopnea, palpitations, paroxysmal nocturnal dyspnea. Dermatological: No rash, lesions/masses Respiratory: No cough, dyspnea Urologic: No hematuria, dysuria Abdominal:   No nausea, vomiting, diarrhea, bright red blood per rectum, melena, or hematemesis Neurologic:  No visual changes, wkns, changes in mental status. All other systems reviewed and are otherwise negative except as noted above.     Physical Exam    VS:  BP 118/64  (BP Location: Left Arm, Patient Position: Sitting, Cuff Size: Normal)   Pulse 72   Ht 5\' 10"  (1.778 m)   Wt 245 lb 12.8 oz (111.5 kg)   SpO2 97%   BMI 35.27 kg/m  , BMI Body mass index is 35.27 kg/m.     GEN: Well nourished, well developed, in no acute distress.  Obese HEENT: normal. Neck: Supple, no JVD, carotid bruits, or masses. Cardiac: RRR, no murmurs, rubs, or gallops. No clubbing, cyanosis, edema.  Radials/DP/PT 2+ and equal bilaterally.  Respiratory:  Respirations regular and unlabored, clear to auscultation bilaterally. GI: Soft, nontender, nondistended, BS + x 4. MS: no deformity or atrophy. Skin: warm and dry, no rash. Neuro:  Strength and sensation are intact. Psych: Normal affect.  Accessory Clinical Findings      Lab Results  Component Value Date   WBC 3.9 (L) 03/05/2022   HGB 14.9 03/05/2022   HCT 45.2 03/05/2022   MCV 103.0 (H) 03/05/2022   PLT 217.0 03/05/2022   Lab Results  Component Value Date   CREATININE 1.61 (H) 03/05/2022   BUN 19 03/05/2022   NA 135 03/05/2022   K 3.6 03/05/2022   CL 105 03/05/2022   CO2 20 03/05/2022   Lab Results  Component Value Date   ALT 20 09/09/2020   AST 29 09/09/2020   ALKPHOS 109 09/09/2020   BILITOT 0.3 09/09/2020   No results found for: "CHOL", "HDL", "LDLCALC", "LDLDIRECT", "TRIG", "CHOLHDL"  No results found for: "HGBA1C"  Review of Prior Studies: Echocardiogram 01/08/2022     1. Left ventricular ejection fraction, by estimation, is 60 to 65%. The  left ventricle has normal function. The left ventricle has no regional  wall motion abnormalities. There is moderate concentric left ventricular  hypertrophy. Left ventricular  diastolic parameters are consistent with Grade I diastolic dysfunction  (impaired relaxation).   2. Right ventricular systolic function is normal. The right ventricular  size is normal.   3. Left atrial size was moderately dilated.   4. Right atrial size was mildly dilated.   5. The  mitral valve is normal in structure. Trivial mitral valve  regurgitation. No evidence of mitral stenosis.   6. The aortic valve is normal in structure. Aortic valve regurgitation is  not visualized. No aortic stenosis is present.   7. The inferior vena cava is normal in size with greater than 50%  respiratory variability, suggesting right atrial pressure of 3 mmHg.    Assessment & Plan   1.  PSVT: Status post SVT ablation 01/17/2019. He has only had 1 episode of rapid heart rate which occurred approximately 6 months ago around the Christmas holidays after drinking alcohol.  He continues medically compliant and on metoprolol XL 50 mg daily.  Heart rate is well-controlled on review of EKG.  No changes in his medical regimen.  Refills are provided by PCP.  2.  Hypertension: He remains on benazepril 40 mg daily, Lasix daily with good blood pressure control.  No change in his regimen at this time.  Lab for follow-up are completed by primary care.  3.  OSA: Recently is started on CPAP.  Getting used to it.  Normally works nights on interval shifts.  Has not felt any improvement in his sleep hygiene as of yet.  4.  History of DVT: Remains on Xarelto.  Followed by PCP       Signed, Bettey Mare. Liborio Nixon, ANP, AACC   01/18/2023 5:00 PM      Office [phone] Fax [phone]  Notice: This dictation was prepared with Dragon dictation along with smaller phrase technology. Any transcriptional errors that result from this process are unintentional and may not be corrected upon review.

## 2023-01-18 ENCOUNTER — Other Ambulatory Visit: Payer: Self-pay

## 2023-01-18 ENCOUNTER — Ambulatory Visit: Payer: 59 | Attending: Adult Health | Admitting: Adult Health

## 2023-01-18 ENCOUNTER — Encounter: Payer: Self-pay | Admitting: Adult Health

## 2023-01-18 VITALS — BP 118/64 | HR 72 | Ht 70.0 in | Wt 245.8 lb

## 2023-01-18 DIAGNOSIS — I471 Supraventricular tachycardia, unspecified: Secondary | ICD-10-CM

## 2023-01-18 NOTE — Patient Instructions (Signed)
Medication Instructions:  No Changes *If you need a refill on your cardiac medications before your next appointment, please call your pharmacy*   Lab Work: No labs If you have labs (blood work) drawn today and your tests are completely normal, you will receive your results only by: MyChart Message (if you have MyChart) OR A paper copy in the mail If you have any lab test that is abnormal or we need to change your treatment, we will call you to review the results.   Testing/Procedures: No Testing   Follow-Up: At [facility], you and your health needs are our priority.  As part of our continuing mission to provide you with exceptional heart care, we have created designated Provider Care Teams.  These Care Teams include your primary Cardiologist (physician) and Advanced Practice Providers (APPs -  Physician Assistants and Nurse Practitioners) who all work together to provide you with the care you need, when you need it.  We recommend signing up for the patient portal called "MyChart".  Sign up information is provided on this After Visit Summary.  MyChart is used to connect with patients for Virtual Visits (Telemedicine).  Patients are able to view lab/test results, encounter notes, upcoming appointments, etc.  Non-urgent messages can be sent to your provider as well.   To learn more about what you can do with MyChart, go to [email or web address].    Your next appointment:   1 year(s)  Provider:   Brian Crenshaw, MD   

## 2023-01-20 ENCOUNTER — Other Ambulatory Visit: Payer: Self-pay

## 2023-01-20 ENCOUNTER — Encounter (HOSPITAL_COMMUNITY): Payer: Self-pay | Admitting: Gastroenterology

## 2023-01-20 NOTE — Progress Notes (Signed)
COVID Vaccine Completed: yes  Date of COVID positive in last 90 days: no  PCP - Johny Blamer, MD Cardiologist - Olga Millers, MD LOV 01/17/22 Neurology- Gareth Morgan, MD LOV 10/20/22  Chest x-ray - n/a EKG - 01/18/23 Epic Stress Test - 01/25/19 Epic ECHO - 01/09/23 Epic Cardiac Cath - n/a Pacemaker/ICD device last checked: n/a Spinal Cord Stimulator: n/a  Bowel Prep - pt has instructions.  Sleep Study - yes CPAP - yes every night  Fasting Blood Sugar - not currently checking Checks Blood Sugar   Last dose of GLP1 agonist-  N/A GLP1 instructions:  N/A   Last dose of SGLT-2 inhibitors-  N/A SGLT-2 instructions: N/A   Blood Thinner Instructions:  Xarelto, hold 2 days Aspirin Instructions: Last Dose: 01/31/23 0900  Activity level: Can go up a flight of stairs and perform activities of daily living without stopping and without symptoms of chest pain or shortness of breath.  Anesthesia review: DVT, HTN, SVT, DM2, CKD3, OSA, tremors   Patient denies shortness of breath, fever, cough and chest pain   Patient verbalized understanding of instructions that were given to them

## 2023-01-24 ENCOUNTER — Ambulatory Visit: Payer: 59 | Admitting: Neurology

## 2023-01-24 ENCOUNTER — Encounter: Payer: Self-pay | Admitting: Neurology

## 2023-01-24 VITALS — BP 130/71 | HR 73 | Ht 70.0 in | Wt 248.0 lb

## 2023-01-24 DIAGNOSIS — G4726 Circadian rhythm sleep disorder, shift work type: Secondary | ICD-10-CM | POA: Diagnosis not present

## 2023-01-24 DIAGNOSIS — G250 Essential tremor: Secondary | ICD-10-CM

## 2023-01-24 DIAGNOSIS — G4733 Obstructive sleep apnea (adult) (pediatric): Secondary | ICD-10-CM

## 2023-01-24 NOTE — Patient Instructions (Signed)
It was nice to see you again today. I am glad to hear, things are going fairly well with your autoPAP therapy. You have adjusted well to treatment with your new machine, and you are compliant with it. You have also fulfilled the insurance-mandated compliance percentage, which is reassuring, so you can get ongoing supplies through your insurance. Please talk to your DME provider about getting replacement supplies on a regular basis. Please be sure to change your filter every month, your mask about every 3 months, hose about every 6 months, humidifier chamber about yearly. Some restrictions are imposed by your insurance carrier with regard to how frequently you can get certain supplies.  Your DME company can provide further details if necessary.   Please continue using your autoPAP regularly. While your insurance requires that you use PAP at least 4 hours each night on 70% of the nights, I recommend, that you not skip any nights and use it throughout the night if you can. Getting used to PAP and staying with the treatment long term does take time and patience and discipline. Untreated obstructive sleep apnea when it is moderate to severe can have an adverse impact on cardiovascular health and raise her risk for heart disease, arrhythmias, hypertension, congestive heart failure, stroke and diabetes. Untreated obstructive sleep apnea causes sleep disruption, nonrestorative sleep, and sleep deprivation. This can have an impact on your day to day functioning and cause daytime sleepiness and impairment of cognitive function, memory loss, mood disturbance, and problems focussing. Using PAP regularly can improve these symptoms.  We can see you in 1 year, you can see one of our nurse practitioners as you are stable.   Please make more time for sleep, try to take Melatonin, 3 to 6 mg 1-2 hours before bedtime and plan 8 hours for sleep.

## 2023-01-24 NOTE — Progress Notes (Signed)
Subjective:    Patient ID: Dalton Ochoa is a 62 y.o. male.  HPI    Interim history:   Dalton Ochoa is a 62 year old male with an underlying medical history of diabetes, hypertension, hyperlipidemia, history of DVT, PE, on Xarelto, Raynaud's disease, venous stasis, gout, SVT with status post ablation in June 2020, essential tremor, and obesity, who presents for follow-up consultation of his Obstructive sleep apnea after interim testing and starting home AutoPap therapy.  Patient is unaccompanied today.  I first met him on 09/09/2022, at which time he reported feeling stable on his medication regimen for his essential tremor.  He has been seeing Margie Ege, NP and previously Dr. Stephanie Acre.  He reported not sleeping well.  He also reported a prior diagnosis of sleep apnea.  He was advised to proceed with a sleep study.  He had a home sleep test on 10/20/2022 which showed overall mild obstructive sleep apnea with an AHI of 8.5/h, O2 nadir 78% with variable snoring detected.  Given his poor sleep consolidation was advised to proceed with home AutoPap therapy.  His set up date was 11/18/2022.  He has a ResMed air sense 11 AutoSet machine.  His DME provider is [facility].   Today, 01/24/2023: I reviewed his AutoPap compliance data from 11/18/2022 through 12/17/2022, which is a total of 30 days, during which time he used his machine every day with percent use days greater than 4 hours at 60%, indicating mildly suboptimal compliance with an average usage of 4 hours and 20 minutes, residual AHI at goal at 1.1/h, 95th percentile of pressure at 9.3 cm with a range of 5 to 13 cm with EPR of 3.  Leak acceptable with the 95th percentile at 16.6 mg/min.  In the past 60 days his compliance has overall declined a little bit, more so in the past 2 to 3 weeks.  He does not sleep well, he goes to bed around 11 or 12.  He works from 7 PM to 7 AM.  He has trouble falling asleep, he tried melatonin in the past but  found it difficult to get out of deep sleep.  He is motivated to continue with treatment.  He is open to trying melatonin again.  Tremor is stable, does not need a refill on his Topamax.  He is using a fullface mask, and overall tolerates the pressure and the interface fine.    Previously (copied from previous notes for reference):   09/09/2022 (SA): He reports doing fairly well with the topiramate.  He takes it first thing in the morning as he works nights.  He works from 7 PM to 7 AM.  He may be in bed somewhere between 11 AM and noon.  He has not noticed any side effects from the topiramate.  He takes 100 mg in the mornings.  He does not sleep well.  He sleeps only till 4 or 4:30 PM and does endorse a history of obstructive sleep apnea.  He was diagnosed in the 90s as he recalls and has not used a CPAP machine in years, he reports that he threw it away.  He would be willing to get reevaluated with a home sleep test.  He inquired about inspire and I explained the inclusion criteria to him and the treatment option.  He is encouraged to get evaluated and possibly consider an AutoPap machine.  His mom had a tremor, mostly around her mouth.  He denies any daily caffeine use.  He  drinks alcohol occasionally, on his days off.  He tries to hydrate well with water and Gatorade.  He has not noticed any change in his taste especially when drinking soda.  He may drink a soda as needed.  He has low back pain and takes Lyrica per [facility].  He had a tonsillectomy as a child.       << 09/09/21 (SS): Dalton Ochoa here today for follow-up with history of tremor in the right hand.  Last year he was switched from Mysoline to Topamax due to potential interaction between Mysoline and Xarelto.  Has done well with Topamax.  Denies side effect.  Tremor remains stable in the right hand, with holding an object, or handwriting.  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  He works night shift. Looking forward to [place] trip.    Update 09/09/2020 SS: [name] is a 62 year old male with history of tremor affecting his right hand.  He remains on Mysoline 50 mg at bedtime.  MRI of the brain has shown minimal white matter changes in the past.  Feels the tremor to the right hand has gradually worsened slowly over time.  Notices the tremor when the hand is turned in motion, such as pouring a drink.  No trouble eating.  He works as a Location manager, has to record a log every 30 minutes, writing numbers, he has the most difficulty with this, especially "8", he will often substitute another number that is in range.  He works night shift.  Tolerates Mysoline, feels the medication is helpful. Has cut back on caffeine, has helped tremor. Denies any other symptoms. Is working out, reports 8 lb weight loss. His mother had a jaw tremor.  He is on Xarelto, reports has been taking for years.  Here today for evaluation unaccompanied.   HISTORY   09/10/2019 SS: [name] is a 62 year old male with history of tremor affecting his right hand.  The tremor worsens when he is upset.  He remains on Mysoline.  MRI of the brain has shown minimal white matter changes in the past.  His tremor is under good control, does not affect his handwriting or eating.  He works as a Location manager.  He is tolerating Mysoline without side effect.  Since last seen, says he had SVT, had cardiac ablation, has not had further problems.  He presents today for evaluation unaccompanied. >>    The patient's allergies, current medications, family history, past medical history, past social history, past surgical history and problem list were reviewed and updated as appropriate.    His Past Medical History Is Significant For: Past Medical History:  Diagnosis Date   Arthritis    Diabetes mellitus without complication (HCC)    DVT (deep venous thrombosis) (HCC)    Gout    Hyperlipidemia     Hypertension    Inguinal hernia    Occasional tremors    Right hand   Pulmonary emboli (HCC) 07/21/2005   Raynaud's disease    Sleep apnea    Varicose veins    Venous stasis     His Past Surgical History Is Significant For: Past Surgical History:  Procedure Laterality Date   COLONOSCOPY WITH PROPOFOL N/A 03/24/2022   Procedure: COLONOSCOPY WITH PROPOFOL;  Surgeon: Lemar Lofty., MD;  Location: [facility];  Service: Gastroenterology;  Laterality: N/A;   ENDOSCOPIC MUCOSAL RESECTION N/A 03/24/2022   Procedure:  ENDOSCOPIC MUCOSAL RESECTION;  Surgeon: Meridee Score, Netty Starring., MD;  Location: [facility];  Service: Gastroenterology;  Laterality: N/A;   ENDOVENOUS ABLATION SAPHENOUS VEIN W/ LASER Right 10/10/2014   EVLA right greater saphenous vein by Gretta Began MD   ENDOVENOUS ABLATION SAPHENOUS VEIN W/ LASER Left 12/05/2014   endovenous laser ablation left small saphenous vein by Gretta Began MD   HEMOSTASIS CLIP PLACEMENT  03/24/2022   Procedure: HEMOSTASIS CLIP PLACEMENT;  Surgeon: Lemar Lofty., MD;  Location: [facility];  Service: Gastroenterology;;   INGUINAL HERNIA REPAIR Right    NESBIT PROCEDURE N/A 09/02/2017   Procedure: NESBIT PROCEDURE AND BLOCK;  Surgeon: Sebastian Ache, MD;  Location: [facility];  Service: Urology;  Laterality: N/A;   POLYPECTOMY  03/24/2022   Procedure: POLYPECTOMY;  Surgeon: Meridee Score Netty Starring., MD;  Location: [facility];  Service: Gastroenterology;;   Brooke Dare INJECTION  03/24/2022   Procedure: SUBMUCOSAL LIFTING INJECTION;  Surgeon: Lemar Lofty., MD;  Location: [facility];  Service: Gastroenterology;;   SVT ABLATION N/A 01/17/2019   Procedure: SVT ABLATION;  Surgeon: Regan Lemming, MD;  Location: [facility];  Service: Cardiovascular;  Laterality: N/A;   TESTICLE REMOVAL Right    TONSILLECTOMY      His Family History Is Significant For: Family History  Problem Relation  Age of Onset   Hypertension Mother    Diabetes Father    Deep vein thrombosis Father    Dilated cardiomyopathy Brother    Heart disease Brother    Diabetes Paternal Grandmother    Heart disease Daughter    Colon cancer Neg Hx    Esophageal cancer Neg Hx    Inflammatory bowel disease Neg Hx    Liver disease Neg Hx    Pancreatic cancer Neg Hx    Rectal cancer Neg Hx    Stomach cancer Neg Hx    Sleep apnea Neg Hx     His Social History Is Significant For: Social History   Socioeconomic History   Marital status: Married    Spouse name: Dalton Ochoa   Number of children: 2   Years of education: 14   Highest education level: Not on file  Occupational History   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  Tobacco Use   Smoking status: Never   Smokeless tobacco: Never  Vaping Use   Vaping Use: Never used  Substance and Sexual Activity   Alcohol use: Yes    Alcohol/week: 6.0 - 12.0 standard drinks of alcohol    Types: 6 - 12 Cans of beer per week   Drug use: No   Sexual activity: Not on file  Other Topics Concern   Not on file  Social History Narrative   Lives with wife and two daughters   Caffeine use: 2 sodas per month   Right handed    Social Determinants of Health   Financial Resource Strain: Not on file  Food Insecurity: Not on file  Transportation Needs: Not on file  Physical Activity: Not on file  Stress: Not on file  Social Connections: Not on file    His Allergies Are:  Allergies  Allergen Reactions   Tramadol Hcl Rash  :   His Current Medications Are:  Outpatient Encounter Medications as of 01/24/2023  Medication Sig   allopurinol (ZYLOPRIM) 100 MG tablet Take 1 tablet by mouth 2 (two) times daily.   benazepril (LOTENSIN) 40 MG tablet Take 40 mg by mouth daily in the afternoon.   buPROPion (WELLBUTRIN XL) 300 MG  24 hr tablet Take 300 mg by mouth daily.   colchicine (COLCRYS) 0.6 MG tablet Take 0.6 mg by mouth daily as needed (gout).   COVID-19 mRNA vaccine 2023-2024  (COMIRNATY) syringe Inject into the muscle.   furosemide (LASIX) 20 MG tablet Take 20 mg by mouth daily.   hydroxychloroquine (PLAQUENIL) 200 MG tablet Take 200 mg by mouth 2 (two) times daily.   ipratropium (ATROVENT) 0.06 % nasal spray Place 1 spray into both nostrils 2 (two) times daily as needed for rhinitis.   metFORMIN (GLUCOPHAGE) 500 MG tablet Take 500 mg by mouth daily.   metoprolol succinate (TOPROL-XL) 50 MG 24 hr tablet Take 50 mg by mouth daily.   pravastatin (PRAVACHOL) 20 MG tablet Take 20 mg by mouth daily.    pregabalin (LYRICA) 75 MG capsule Take 75 mg by mouth 2 (two) times daily as needed (pain).   tadalafil (CIALIS) 20 MG tablet Take 20 mg by mouth daily as needed.   topiramate (TOPAMAX) 100 MG tablet Take 1 tablet (100 mg total) by mouth at bedtime.   XARELTO 20 MG TABS tablet Take 1 tablet (20 mg total) by mouth daily.   No facility-administered encounter medications on file as of 01/24/2023.  :  Review of Systems:  Out of a complete 14 point review of systems, all are reviewed and negative with the exception of these symptoms as listed below:  Review of Systems  Neurological:        Pt here for CPAP f/u Pt states he works third shift Pt states he gets 4 hours of sleep daily..    ESS:8    Objective:  Neurological Exam  Physical Exam Physical Examination:   Vitals:   01/24/23 0945  BP: 130/71  Pulse: 73    General Examination: The patient is a very pleasant 62 y.o. male in no acute distress. He appears well-developed and well-nourished and well groomed.   HEENT: Normocephalic, atraumatic, pupils are equal, round and reactive to light, extraocular tracking is well-preserved, face is symmetric, no dysarthria, hypophonia or voice tremor.  Neck with full range of motion, no carotid bruits.  Airway examination reveals stable findings.   Partial dentures top and bottom, and significant airway crowding. Tongue protrudes centrally and palate elevates  symmetrically.  Tip of uvula not visualized.     Chest: Clear to auscultation without wheezing, rhonchi or crackles noted.   Heart: S1+S2+0, regular and normal without murmurs, rubs or gallops noted.    Abdomen: Soft, non-tender and non-distended.   Extremities: There is no obvious swelling in the distal lower extremities bilaterally.    Skin: Warm and dry without trophic changes noted.    Musculoskeletal: exam reveals no obvious joint deformities.    Neurologically:  Mental status: The patient is awake, alert and oriented in all 4 spheres. His immediate and remote memory, attention, language skills and fund of knowledge are appropriate. There is no evidence of aphasia, agnosia, apraxia or anomia. Speech is clear with normal prosody and enunciation. Thought process is linear. Mood is normal and affect is normal.  Cranial nerves II - XII are as described above under HEENT exam.  Motor exam: Normal bulk, strength and tone is noted. There is no resting tremor, he has no significant action or postural tremor today.    Fine motor skills and coordination: grossly intact.  Cerebellar testing: No dysmetria or intention tremor. There is no truncal or gait ataxia.  Sensory exam: intact to light touch in the upper and lower  extremities.  Gait, station and balance: He stands easily. No veering to one side is noted. No leaning to one side is noted. Posture is age-appropriate and stance is narrow based. Gait shows normal stride length and normal pace. No problems turning are noted.    Assessment and Plan:    In summary, DAIR NECESSARY is a very pleasant 62 y.o.-year old male with an underlying medical history of diabetes, hypertension, hyperlipidemia, obstructive sleep apnea (no longer on PAP therapy), history of DVT, PE, on Xarelto, Raynaud's disease, venous stasis, gout, SVT with status post ablation in June 2020, essential tremor, and obesity, who presents for follow-up consultation of his  obstructive sleep apnea after interim testing and starting home AutoPap therapy. He had a home sleep test on 10/20/2022 which showed overall mild obstructive sleep apnea with an AHI of 8.5/h, O2 nadir 78% with variable snoring detected. He had been on on home AutoPap therapy since 11/18/2022.  He has a ResMed air sense 11 AutoSet machine.  His DME provider is [facility].  He uses a fullface mask.  He is generally compliant with using his machine but not always fully consistent with it.  He is encouraged to make more time for sleep, despite the challenges of shift work.  He is encouraged to try melatonin again for sleep, 3 to 6 mg about an hour or 2 before his projected bedtime.  He is advised to allow for 8 hours of sleep after his shift work.  He is advised about his sleep test results and improvements on his download.  He is encouraged to make on it more consistently and continue with his medication regimen for his tremors.  He is advised to follow-up routinely in 1 year as scheduled with Margie Ege, NP.  I answered all his questions today and he was in agreement.

## 2023-01-28 ENCOUNTER — Encounter: Payer: Self-pay | Admitting: Gastroenterology

## 2023-02-02 NOTE — Anesthesia Preprocedure Evaluation (Addendum)
Anesthesia Evaluation  Patient identified by MRN, date of birth, ID band Patient awake    Reviewed: Allergy & Precautions, NPO status , Patient's Chart, lab work & pertinent test results  Airway Mallampati: II  TM Distance: >3 FB Neck ROM: Full    Dental no notable dental hx. (+) Teeth Intact, Dental Advisory Given   Pulmonary sleep apnea , PE   Pulmonary exam normal breath sounds clear to auscultation       Cardiovascular hypertension, Pt. on medications and Pt. on home beta blockers + DVT  Normal cardiovascular exam+ dysrhythmias Supra Ventricular Tachycardia  Rhythm:Regular Rate:Normal  1. Left ventricular ejection fraction, by estimation, is 60 to 65%. The  left ventricle has normal function. The left ventricle has no regional  wall motion abnormalities. There is moderate concentric left ventricular  hypertrophy. Left ventricular  diastolic parameters are consistent with Grade I diastolic dysfunction  (impaired relaxation).   2. Right ventricular systolic function is normal. The right ventricular  size is normal.   3. Left atrial size was moderately dilated.   4. Right atrial size was mildly dilated.   5. The mitral valve is normal in structure. Trivial mitral valve  regurgitation. No evidence of mitral stenosis.   6. The aortic valve is normal in structure. Aortic valve regurgitation is  not visualized. No aortic stenosis is present.   7. The inferior vena cava is normal in size with greater than 50%  respiratory variability, suggesting right atrial pressure of 3 mmHg.      Neuro/Psych    GI/Hepatic ,,,(+)     substance abuse  alcohol use  Endo/Other  diabetes, Type 2, Oral Hypoglycemic Agents    Renal/GU      Musculoskeletal   Abdominal  (+) + obese  Peds  Hematology   Anesthesia Other Findings All: Tramadol  Reproductive/Obstetrics                             Anesthesia  Physical Anesthesia Plan  ASA: 3  Anesthesia Plan: MAC   Post-op Pain Management: Minimal or no pain anticipated   Induction:   PONV Risk Score and Plan: Treatment may vary due to age or medical condition and Propofol infusion  Airway Management Planned: Natural Airway and Nasal Cannula  Additional Equipment: None  Intra-op Plan:   Post-operative Plan:   Informed Consent: I have reviewed the patients History and Physical, chart, labs and discussed the procedure including the risks, benefits and alternatives for the proposed anesthesia with the patient or authorized representative who has indicated his/her understanding and acceptance.     Dental advisory given  Plan Discussed with:   Anesthesia Plan Comments: (Colonoscopyfor colon polyp)        Anesthesia Quick Evaluation

## 2023-02-03 ENCOUNTER — Ambulatory Visit (HOSPITAL_COMMUNITY): Payer: 59 | Admitting: Anesthesiology

## 2023-02-03 ENCOUNTER — Ambulatory Visit (HOSPITAL_COMMUNITY)
Admission: RE | Admit: 2023-02-03 | Discharge: 2023-02-03 | Disposition: A | Discharge status: Home / Self Care | Payer: 59 | Attending: Gastroenterology | Admitting: Gastroenterology

## 2023-02-03 ENCOUNTER — Ambulatory Visit (HOSPITAL_BASED_OUTPATIENT_CLINIC_OR_DEPARTMENT_OTHER): Payer: 59 | Admitting: Anesthesiology

## 2023-02-03 ENCOUNTER — Other Ambulatory Visit: Payer: Self-pay

## 2023-02-03 ENCOUNTER — Encounter (HOSPITAL_COMMUNITY): Payer: Self-pay | Admitting: Gastroenterology

## 2023-02-03 ENCOUNTER — Encounter (HOSPITAL_COMMUNITY)
Admission: RE | Disposition: A | Discharge status: Home / Self Care | Payer: Self-pay | Source: Home / Self Care | Attending: Gastroenterology

## 2023-02-03 DIAGNOSIS — Z833 Family history of diabetes mellitus: Secondary | ICD-10-CM | POA: Diagnosis not present

## 2023-02-03 DIAGNOSIS — I129 Hypertensive chronic kidney disease with stage 1 through stage 4 chronic kidney disease, or unspecified chronic kidney disease: Secondary | ICD-10-CM

## 2023-02-03 DIAGNOSIS — E669 Obesity, unspecified: Secondary | ICD-10-CM | POA: Insufficient documentation

## 2023-02-03 DIAGNOSIS — K641 Second degree hemorrhoids: Secondary | ICD-10-CM | POA: Diagnosis not present

## 2023-02-03 DIAGNOSIS — D126 Benign neoplasm of colon, unspecified: Secondary | ICD-10-CM

## 2023-02-03 DIAGNOSIS — Z1211 Encounter for screening for malignant neoplasm of colon: Secondary | ICD-10-CM | POA: Diagnosis not present

## 2023-02-03 DIAGNOSIS — G4730 Sleep apnea, unspecified: Secondary | ICD-10-CM | POA: Diagnosis not present

## 2023-02-03 DIAGNOSIS — Z6835 Body mass index (BMI) 35.0-35.9, adult: Secondary | ICD-10-CM | POA: Diagnosis not present

## 2023-02-03 DIAGNOSIS — D124 Benign neoplasm of descending colon: Secondary | ICD-10-CM | POA: Diagnosis not present

## 2023-02-03 DIAGNOSIS — I10 Essential (primary) hypertension: Secondary | ICD-10-CM | POA: Insufficient documentation

## 2023-02-03 DIAGNOSIS — I472 Ventricular tachycardia, unspecified: Secondary | ICD-10-CM | POA: Diagnosis not present

## 2023-02-03 DIAGNOSIS — Z86010 Personal history of colonic polyps: Secondary | ICD-10-CM | POA: Diagnosis not present

## 2023-02-03 DIAGNOSIS — Z86711 Personal history of pulmonary embolism: Secondary | ICD-10-CM | POA: Diagnosis not present

## 2023-02-03 DIAGNOSIS — Z86718 Personal history of other venous thrombosis and embolism: Secondary | ICD-10-CM | POA: Insufficient documentation

## 2023-02-03 DIAGNOSIS — Z7984 Long term (current) use of oral hypoglycemic drugs: Secondary | ICD-10-CM | POA: Insufficient documentation

## 2023-02-03 DIAGNOSIS — I517 Cardiomegaly: Secondary | ICD-10-CM | POA: Diagnosis not present

## 2023-02-03 DIAGNOSIS — E119 Type 2 diabetes mellitus without complications: Secondary | ICD-10-CM | POA: Diagnosis not present

## 2023-02-03 DIAGNOSIS — Z9889 Other specified postprocedural states: Secondary | ICD-10-CM

## 2023-02-03 HISTORY — DX: Sleep apnea, unspecified: G47.30

## 2023-02-03 HISTORY — PX: COLONOSCOPY WITH PROPOFOL: SHX5780

## 2023-02-03 HISTORY — PX: POLYPECTOMY: SHX5525

## 2023-02-03 HISTORY — DX: Unspecified osteoarthritis, unspecified site: M19.90

## 2023-02-03 LAB — GLUCOSE, CAPILLARY: Glucose-Capillary: 76 mg/dL (ref 70–99)

## 2023-02-03 SURGERY — COLONOSCOPY WITH PROPOFOL
Anesthesia: Monitor Anesthesia Care

## 2023-02-03 MED ORDER — PROPOFOL 500 MG/50ML IV EMUL
INTRAVENOUS | Status: DC | PRN
  Administered 2023-02-03: 100 ug/kg/min via INTRAVENOUS

## 2023-02-03 MED ORDER — LACTATED RINGERS IV SOLN: INTRAVENOUS | Status: DC

## 2023-02-03 MED ORDER — XARELTO 20 MG PO TABS: 20.0000 mg | ORAL_TABLET | Freq: Every day | ORAL | Status: AC

## 2023-02-03 MED ORDER — PROPOFOL 1000 MG/100ML IV EMUL
INTRAVENOUS | Status: AC
  Filled 2023-02-03: qty 100

## 2023-02-03 MED ORDER — LIDOCAINE 2% (20 MG/ML) 5 ML SYRINGE
INTRAMUSCULAR | Status: DC | PRN
  Administered 2023-02-03: 60 mg via INTRAVENOUS

## 2023-02-03 MED ORDER — PROPOFOL 10 MG/ML IV BOLUS
INTRAVENOUS | Status: DC | PRN
  Administered 2023-02-03 (×2): 30 mg via INTRAVENOUS
  Administered 2023-02-03: 100 mg via INTRAVENOUS

## 2023-02-03 SURGICAL SUPPLY — 22 items

## 2023-02-03 NOTE — Discharge Instructions (Signed)
YOU HAD AN ENDOSCOPIC PROCEDURE TODAY: Refer to the procedure report and other information in the discharge instructions given to you for any specific questions about what was found during the examination. If this information does not answer your questions, please call [facility] office at [phone] to clarify.  ° °YOU SHOULD EXPECT: Some feelings of bloating in the abdomen. Passage of more gas than usual. Walking can help get rid of the air that was put into your GI tract during the procedure and reduce the bloating. If you had a lower endoscopy (such as a colonoscopy or flexible sigmoidoscopy) you may notice spotting of blood in your stool or on the toilet paper. Some abdominal soreness may be present for a day or two, also. ° °DIET: Your first meal following the procedure should be a light meal and then it is ok to progress to your normal diet. A half-sandwich or bowl of soup is an example of a good first meal. Heavy or fried foods are harder to digest and may make you feel nauseous or bloated. Drink plenty of fluids but you should avoid alcoholic beverages for 24 hours. If you had a esophageal dilation, please see attached instructions for diet.   ° °ACTIVITY: Your care partner should take you home directly after the procedure. You should plan to take it easy, moving slowly for the rest of the day. You can resume normal activity the day after the procedure however YOU SHOULD NOT DRIVE, use power tools, machinery or perform tasks that involve climbing or major physical exertion for 24 hours (because of the sedation medicines used during the test).  ° °SYMPTOMS TO REPORT IMMEDIATELY: °A gastroenterologist can be reached at any hour. Please call [phone]  for any of the following symptoms:  °Following lower endoscopy (colonoscopy, flexible sigmoidoscopy) °Excessive amounts of blood in the stool  °Significant tenderness, worsening of abdominal pains  °Swelling of the abdomen that is new, acute  °Fever of 100° or  higher  °Following upper endoscopy (EGD, EUS, ERCP, esophageal dilation) °Vomiting of blood or coffee ground material  °New, significant abdominal pain  °New, significant chest pain or pain under the shoulder blades  °Painful or persistently difficult swallowing  °New shortness of breath  °Black, tarry-looking or red, bloody stools ° °FOLLOW UP:  °If any biopsies were taken you will be contacted by phone or by letter within the next 1-3 weeks. Call [phone]  if you have not heard about the biopsies in 3 weeks.  °Please also call with any specific questions about appointments or follow up tests. ° °

## 2023-02-03 NOTE — Anesthesia Postprocedure Evaluation (Signed)
Anesthesia Post Note  Patient: Dalton Ochoa  Procedure(s) Performed: COLONOSCOPY WITH PROPOFOL POLYPECTOMY     Patient location during evaluation: Endoscopy Anesthesia Type: MAC Level of consciousness: awake and alert Pain management: pain level controlled Vital Signs Assessment: post-procedure vital signs reviewed and stable Respiratory status: spontaneous breathing, nonlabored ventilation, respiratory function stable and patient connected to nasal cannula oxygen Cardiovascular status: blood pressure returned to baseline and stable Postop Assessment: no apparent nausea or vomiting Anesthetic complications: no   No notable events documented.  Last Vitals:  Vitals:   02/03/23 1249 02/03/23 1259  BP: 134/86 (!) 145/79  Pulse: 65 (!) 58  Resp: 18 19  Temp:    SpO2: 98% 98%    Last Pain:  Vitals:   02/03/23 1259  TempSrc:   PainSc: 0-No pain                 Trevor Iha

## 2023-02-03 NOTE — Op Note (Addendum)
[facility] Patient Name: Dalton Ochoa Procedure Date: 02/03/2023 MRN: [id number] Attending MD: Corliss Parish , MD, [id number] Date of Birth: 12-May-1961 CSN: [id number] Age: 62 Admit Type: Outpatient Procedure:                Colonoscopy Indications:              Surveillance: History of piecemeal removal adenoma                            on last colonoscopy (< 3 yrs) Providers:                Corliss Parish, MD, Margaree Mackintosh, RN,                            Harrington Challenger, Technician Referring MD:             Kathi Der, MD, Johny Blamer Medicines:                Monitored Anesthesia Care Complications:            No immediate complications. Estimated Blood Loss:     Estimated blood loss was minimal. Procedure:                Pre-Anesthesia Assessment:                           - Prior to the procedure, a History and Physical                            was performed, and patient medications and                            allergies were reviewed. The patient's tolerance of                            previous anesthesia was also reviewed. The risks                            and benefits of the procedure and the sedation                            options and risks were discussed with the patient.                            All questions were answered, and informed consent                            was obtained. Prior Anticoagulants: The patient has                            taken Xarelto (rivaroxaban), last dose was 2 days                            prior to procedure. ASA Grade Assessment: III - A  patient with severe systemic disease. After                            reviewing the risks and benefits, the patient was                            deemed in satisfactory condition to undergo the                            procedure.                           After obtaining informed consent, the colonoscope                             was passed under direct vision. Throughout the                            procedure, the patient's blood pressure, pulse, and                            oxygen saturations were monitored continuously. The                            CF-HQ190L ([id number]) Olympus colonoscope was                            introduced through the anus and advanced to the 3                            cm into the ileum. The colonoscopy was performed                            without difficulty. The patient tolerated the                            procedure. The quality of the bowel preparation was                            good. The terminal ileum, ileocecal valve,                            appendiceal orifice, and rectum were photographed. Scope In: 12:16:59 PM Scope Out: 12:28:04 PM Scope Withdrawal Time: 0 hours 6 minutes 7 seconds  Total Procedure Duration: 0 hours 11 minutes 5 seconds  Findings:      The digital rectal exam findings include hemorrhoids. Pertinent       negatives include no palpable rectal lesions.      The terminal ileum and ileocecal valve appeared normal.      A large post mucosectomy scar was found in the cecum. There was no       evidence of the previous polyp.      A 6 mm polyp was found in the descending colon. The polyp was sessile.       The polyp was removed with  a cold snare. Resection and retrieval were       complete.      Normal mucosa was found in the entire colon otherwise.      Non-bleeding non-thrombosed internal hemorrhoids were found during       retroflexion, during perianal exam and during digital exam. The       hemorrhoids were Grade II (internal hemorrhoids that prolapse but reduce       spontaneously). Impression:               - Hemorrhoids found on digital rectal exam.                           - The examined portion of the ileum was normal.                           - Post mucosectomy scar in the cecum.                           - One 6 mm  polyp in the descending colon, removed                            with a cold snare. Resected and retrieved.                           - Normal mucosa in the entire examined colon                            otherwise.                           - Non-bleeding non-thrombosed internal hemorrhoids. Moderate Sedation:      Not Applicable - Patient had care per Anesthesia. Recommendation:           - The patient will be observed post-procedure,                            until all discharge criteria are met.                           - Discharge patient to home.                           - Patient has a contact number available for                            emergencies. The signs and symptoms of potential                            delayed complications were discussed with the                            patient. Return to normal activities tomorrow.                            Written discharge instructions were provided to the  patient.                           - High fiber diet.                           - Use FiberCon 1-2 tablets PO daily.                           - May restart Xarelto in 48 hours to decrease                            post-interventional bleeding risk.                           - Continue present medications.                           - Await pathology results.                           - Repeat colonoscopy in 3 years for surveillance                            due to history of advanced adenoma. You should                            never go more than 5-years between colonoscopic                            procedures due to this history.                           - The findings and recommendations were discussed                            with the patient.                           - The findings and recommendations were discussed                            with the patient's family. Procedure Code(s):        --- Professional ---                            [phone], Colonoscopy, flexible; with removal of                            tumor(s), polyp(s), or other lesion(s) by snare                            technique Diagnosis Code(s):        --- Professional ---                           Z86.010, Personal history of  colonic polyps                           K64.1, Second degree hemorrhoids                           Z98.890, Other specified postprocedural states                           D12.4, Benign neoplasm of descending colon CPT copyright 2022 American [employer] Association. All rights reserved. The codes documented in this report are preliminary and upon coder review may  be revised to meet current compliance requirements. Corliss Parish, MD 02/03/2023 12:34:24 PM Number of Addenda: 0

## 2023-02-03 NOTE — Anesthesia Procedure Notes (Signed)
Procedure Name: MAC Date/Time: 02/03/2023 12:05 PM  Performed by: Maurene Capes, CRNAPre-anesthesia Checklist: Patient identified, Emergency Drugs available, Suction available and Patient being monitored Patient Re-evaluated:Patient Re-evaluated prior to induction Oxygen Delivery Method: Simple face mask Induction Type: IV induction Airway Equipment and Method: Patient positioned with wedge pillow Placement Confirmation: positive ETCO2 Dental Injury: Teeth and Oropharynx as per pre-operative assessment

## 2023-02-03 NOTE — H&P (Signed)
GASTROENTEROLOGY PROCEDURE H&P NOTE   Primary Care Physician: Noberto Retort, MD  HPI: Dalton Ochoa is a 62 y.o. male who presents for Colonoscopy for surveillance after piecemeal EMR of cecal TVA in 2023.  Past Medical History:  Diagnosis Date   Arthritis    Diabetes mellitus without complication (HCC)    DVT (deep venous thrombosis) (HCC)    Gout    Hyperlipidemia    Hypertension    Inguinal hernia    Occasional tremors    Right hand   Pulmonary emboli (HCC) 07/21/2005   Raynaud's disease    Sleep apnea    Varicose veins    Venous stasis    Past Surgical History:  Procedure Laterality Date   COLONOSCOPY WITH PROPOFOL N/A 03/24/2022   Procedure: COLONOSCOPY WITH PROPOFOL;  Surgeon: Lemar Lofty., MD;  Location: [facility];  Service: Gastroenterology;  Laterality: N/A;   ENDOSCOPIC MUCOSAL RESECTION N/A 03/24/2022   Procedure: ENDOSCOPIC MUCOSAL RESECTION;  Surgeon: Meridee Score Netty Starring., MD;  Location: [facility];  Service: Gastroenterology;  Laterality: N/A;   ENDOVENOUS ABLATION SAPHENOUS VEIN W/ LASER Right 10/10/2014   EVLA right greater saphenous vein by Gretta Began MD   ENDOVENOUS ABLATION SAPHENOUS VEIN W/ LASER Left 12/05/2014   endovenous laser ablation left small saphenous vein by Gretta Began MD   HEMOSTASIS CLIP PLACEMENT  03/24/2022   Procedure: HEMOSTASIS CLIP PLACEMENT;  Surgeon: Lemar Lofty., MD;  Location: [facility];  Service: Gastroenterology;;   INGUINAL HERNIA REPAIR Right    NESBIT PROCEDURE N/A 09/02/2017   Procedure: NESBIT PROCEDURE AND BLOCK;  Surgeon: Sebastian Ache, MD;  Location: [facility];  Service: Urology;  Laterality: N/A;   POLYPECTOMY  03/24/2022   Procedure: POLYPECTOMY;  Surgeon: Meridee Score Netty Starring., MD;  Location: [facility];  Service: Gastroenterology;;   Brooke Dare INJECTION  03/24/2022   Procedure: SUBMUCOSAL LIFTING INJECTION;  Surgeon: Lemar Lofty., MD;   Location: [facility];  Service: Gastroenterology;;   SVT ABLATION N/A 01/17/2019   Procedure: SVT ABLATION;  Surgeon: Regan Lemming, MD;  Location: [facility];  Service: Cardiovascular;  Laterality: N/A;   TESTICLE REMOVAL Right    TONSILLECTOMY     No current facility-administered medications for this encounter.   No current facility-administered medications for this encounter. Allergies  Allergen Reactions   Tramadol Hcl Rash   Family History  Problem Relation Age of Onset   Hypertension Mother    Diabetes Father    Deep vein thrombosis Father    Dilated cardiomyopathy Brother    Heart disease Brother    Diabetes Paternal Grandmother    Heart disease Daughter    Colon cancer Neg Hx    Esophageal cancer Neg Hx    Inflammatory bowel disease Neg Hx    Liver disease Neg Hx    Pancreatic cancer Neg Hx    Rectal cancer Neg Hx    Stomach cancer Neg Hx    Sleep apnea Neg Hx    Social History   Socioeconomic History   Marital status: Married    Spouse name: Mona   Number of children: 2   Years of education: 14   Highest education level: Not on file  Occupational History   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  Tobacco Use   Smoking status: Never   Smokeless tobacco: Never  Vaping Use   Vaping status: Never Used  Substance and Sexual Activity   Alcohol use: Yes    Alcohol/week: 6.0 - 12.0  standard drinks of alcohol    Types: 6 - 12 Cans of beer per week   Drug use: No   Sexual activity: Not on file  Other Topics Concern   Not on file  Social History Narrative   Lives with wife and two daughters   Caffeine use: 2 sodas per month   Right handed    Social Determinants of Health   Financial Resource Strain: Not on file  Food Insecurity: Not on file  Transportation Needs: Not on file  Physical Activity: Not on file  Stress: Not on file  Social Connections: Not on file  Intimate Partner Violence: Not on file    Physical Exam: Today's Vitals   01/20/23  1409  Weight: 111.1 kg  Height: 5\' 10"  (1.778 m)   Body mass index is 35.15 kg/m. GEN: NAD EYE: Sclerae anicteric ENT: MMM CV: Non-tachycardic GI: Soft, NT/ND NEURO:  Alert & Oriented x 3  Lab Results: No results for input(s): "WBC", "HGB", "HCT", "PLT" in the last 72 hours. BMET No results for input(s): "NA", "K", "CL", "CO2", "GLUCOSE", "BUN", "CREATININE", "CALCIUM" in the last 72 hours. LFT No results for input(s): "PROT", "ALBUMIN", "AST", "ALT", "ALKPHOS", "BILITOT", "BILIDIR", "IBILI" in the last 72 hours. PT/INR No results for input(s): "LABPROT", "INR" in the last 72 hours.   Impression / Plan: This is a 62 y.o.male who presents for Colonoscopy for surveillance after piecemeal EMR of cecal TVA in 2023.  The risks and benefits of endoscopic evaluation/treatment were discussed with the patient and/or family; these include but are not limited to the risk of perforation, infection, bleeding, missed lesions, lack of diagnosis, severe illness requiring hospitalization, as well as anesthesia and sedation related illnesses.  The patient's history has been reviewed, patient examined, no change in status, and deemed stable for procedure.  The patient and/or family is agreeable to proceed.    Corliss Parish, MD [facility]oscopy Office # [phone]

## 2023-02-03 NOTE — Transfer of Care (Signed)
Immediate Anesthesia Transfer of Care Note  Patient: Dalton Ochoa  Procedure(s) Performed: COLONOSCOPY WITH PROPOFOL POLYPECTOMY  Patient Location: PACU and Endoscopy Unit  Anesthesia Type:MAC  Level of Consciousness: drowsy and patient cooperative  Airway & Oxygen Therapy: Patient Spontanous Breathing and Patient connected to face mask oxygen  Post-op Assessment: Report given to RN and Post -op Vital signs reviewed and stable  Post vital signs: Reviewed and stable  Last Vitals:  Vitals Value Taken Time  BP 144/81 02/03/23 1240  Temp 35.9 C 02/03/23 1239  Pulse 76 02/03/23 1241  Resp 17 02/03/23 1241  SpO2 100 % 02/03/23 1241  Vitals shown include unfiled device data.  Last Pain:  Vitals:   02/03/23 1239  TempSrc: Tympanic  PainSc: Asleep         Complications: No notable events documented.

## 2023-02-04 LAB — SURGICAL PATHOLOGY

## 2023-02-09 ENCOUNTER — Encounter: Payer: Self-pay | Admitting: Gastroenterology

## 2023-04-07 ENCOUNTER — Other Ambulatory Visit (HOSPITAL_BASED_OUTPATIENT_CLINIC_OR_DEPARTMENT_OTHER): Payer: Self-pay

## 2023-04-07 MED ORDER — COMIRNATY 30 MCG/0.3ML IM SUSY
0.3000 mL | PREFILLED_SYRINGE | Freq: Once | INTRAMUSCULAR | 0 refills | Status: AC
  Filled 2023-04-07: qty 0.3, 1d supply, fill #0

## 2023-09-15 ENCOUNTER — Ambulatory Visit: Payer: 59 | Admitting: Neurology

## 2023-09-15 ENCOUNTER — Telehealth: Payer: Self-pay

## 2023-09-15 ENCOUNTER — Telehealth (INDEPENDENT_AMBULATORY_CARE_PROVIDER_SITE_OTHER): Payer: 59 | Admitting: Neurology

## 2023-09-15 DIAGNOSIS — G4733 Obstructive sleep apnea (adult) (pediatric): Secondary | ICD-10-CM | POA: Diagnosis not present

## 2023-09-15 DIAGNOSIS — G250 Essential tremor: Secondary | ICD-10-CM

## 2023-09-15 MED ORDER — TOPIRAMATE 100 MG PO TABS: 100.0000 mg | ORAL_TABLET | Freq: Every day | ORAL | 3 refills | Status: DC

## 2023-09-15 NOTE — Progress Notes (Signed)
Virtual Visit via Video Note  I connected with Dalton Ochoa on 09/15/23 at  9:45 AM EST by a video enabled telemedicine application and verified that I am speaking with the correct person using two identifiers.  Location: Patient: at his home Provider: at my home   I discussed the limitations of evaluation and management by telemedicine and the availability of in person appointments. The patient expressed understanding and agreed to proceed.  History of Present Illness: Update 09/15/23 SS: Here for VV. 90 day CPAP report 71/90 days usage 76% compliance, > 4 hours 43%, 4 hours 6 minutes, 5-13 cm, EPR 3, leak 4.6, AHI, 0.6. Remains on Topamax 100 mg at bedtime. He works Engineer, water. Sleeps during the day, usually only sleeps about 4 hours. Uses FFM. If he naps without the CPAP, feels sluggish without it. Feels better with it on. Does still struggle with comfort ability of the CPAP hose. Tremor is stable, mostly with action in the right hand. Works as Location manager. Writing is impaired with tremor. Topamax helps. Also takes metoprolol for cardiac reasons. Health has been good. No new problems or concerns.      Dr. Frances Furbish 01/24/23 SS: Dalton Ochoa is a 63 year old male with an underlying medical history of diabetes, hypertension, hyperlipidemia, history of DVT, PE, on Xarelto, Raynaud's disease, venous stasis, gout, SVT with status post ablation in June 2020, essential tremor, and obesity, who presents for follow-up consultation of his Obstructive sleep apnea after interim testing and starting home AutoPap therapy.  Patient is unaccompanied today.  I first met him on 09/09/2022, at which time he reported feeling stable on his medication regimen for his essential tremor.  He has been seeing Margie Ege, NP and previously Dr. Stephanie Acre.  He reported not sleeping well.  He also reported a prior diagnosis of sleep apnea.  He was advised to proceed with a sleep study.  He had a home sleep  test on 10/20/2022 which showed overall mild obstructive sleep apnea with an AHI of 8.5/h, O2 nadir 78% with variable snoring detected.  Given his poor sleep consolidation was advised to proceed with home AutoPap therapy.  His set up date was 11/18/2022.  He has a ResMed air sense 11 AutoSet machine.  His DME provider is [facility].    Today, 01/24/2023: I reviewed his AutoPap compliance data from 11/18/2022 through 12/17/2022, which is a total of 30 days, during which time he used his machine every day with percent use days greater than 4 hours at 60%, indicating mildly suboptimal compliance with an average usage of 4 hours and 20 minutes, residual AHI at goal at 1.1/h, 95th percentile of pressure at 9.3 cm with a range of 5 to 13 cm with EPR of 3.  Leak acceptable with the 95th percentile at 16.6 mg/min.  In the past 60 days his compliance has overall declined a little bit, more so in the past 2 to 3 weeks.  He does not sleep well, he goes to bed around 11 or 12.  He works from 7 PM to 7 AM.  He has trouble falling asleep, he tried melatonin in the past but found it difficult to get out of deep sleep.  He is motivated to continue with treatment.  He is open to trying melatonin again.  Tremor is stable, does not need a refill on his Topamax.  He is using a fullface mask, and overall tolerates the pressure and the interface fine.     Observations/Objective:  Via video visit, is alert and oriented, speech is clear and concise, facial symmetry noted.  Moves about freely.  Mild tremor noted to the right hand with action holding a water bottle.  Assessment and Plan: OSA on CPAP (HST 10/20/22 which showed overall mild obstructive sleep apnea with an AHI of 8.5/h, O2 nadir 78% with variable snoring detected. He had been on on home AutoPap therapy since 11/18/2022) -Recommend continue nightly usage for minimum of 4 hours.  We will continue current settings.  Discussed the importance of wearing CPAP when taking naps. Reports  subjective benefit, motivated to continue use.  Essential Tremor -Overall stable, continue Topamax 100 mg at bedtime.  Also on metoprolol which may benefit tremor.  Meds ordered this encounter  Medications   topiramate (TOPAMAX) 100 MG tablet    Sig: Take 1 tablet (100 mg total) by mouth at bedtime.    Dispense:  90 tablet    Refill:  3   Follow Up Instructions: 1 year in office   I discussed the assessment and treatment plan with the patient. The patient was provided an opportunity to ask questions and all were answered. The patient agreed with the plan and demonstrated an understanding of the instructions.   The patient was advised to call back or seek an in-person evaluation if the symptoms worsen or if the condition fails to improve as anticipated.  Otila Kluver, DNP  [facility] [address] [place], [place] [address] [phone]

## 2023-09-15 NOTE — Patient Instructions (Signed)
Great to see you today.  Recommend nightly usage of CPAP for minimum of 4 hours.  He will continue with current settings.  Please request updated supplies from DME.  Continue Topamax for tremor management.  Call for worsening symptoms.  Follow-up in 1 year.  Thanks!!

## 2023-09-15 NOTE — Telephone Encounter (Signed)
Called pt and went over medications with him, pt is still taking everything. Medications list is up to date.

## 2023-11-02 ENCOUNTER — Other Ambulatory Visit: Payer: Self-pay | Admitting: Neurology

## 2024-05-03 ENCOUNTER — Other Ambulatory Visit (HOSPITAL_BASED_OUTPATIENT_CLINIC_OR_DEPARTMENT_OTHER): Payer: Self-pay

## 2024-05-03 MED ORDER — COMIRNATY 30 MCG/0.3ML IM SUSY
0.3000 mL | PREFILLED_SYRINGE | Freq: Once | INTRAMUSCULAR | 0 refills | Status: AC
  Filled 2024-05-03: qty 0.3, 1d supply, fill #0

## 2024-09-19 ENCOUNTER — Ambulatory Visit: Payer: 59 | Admitting: Neurology
